# Patient Record
Sex: Female | Born: 1980 | Hispanic: Yes | Marital: Married | Smoking: Never smoker
Health system: Southern US, Academic
[De-identification: ages and names within clinical notes are randomized; demographics above are authoritative.]

## PROBLEM LIST (undated history)

## (undated) ENCOUNTER — Inpatient Hospital Stay (HOSPITAL_COMMUNITY): Payer: Self-pay

## (undated) DIAGNOSIS — N200 Calculus of kidney: Secondary | ICD-10-CM

## (undated) HISTORY — PX: NO PAST SURGERIES: SHX2092

---

## 2017-08-19 ENCOUNTER — Ambulatory Visit (INDEPENDENT_AMBULATORY_CARE_PROVIDER_SITE_OTHER): Payer: Self-pay | Admitting: Nurse Practitioner

## 2017-08-24 ENCOUNTER — Other Ambulatory Visit (HOSPITAL_COMMUNITY): Payer: Self-pay | Admitting: Nurse Practitioner

## 2017-08-24 DIAGNOSIS — Z3A13 13 weeks gestation of pregnancy: Secondary | ICD-10-CM

## 2017-08-24 DIAGNOSIS — Z3682 Encounter for antenatal screening for nuchal translucency: Secondary | ICD-10-CM

## 2017-08-24 LAB — OB RESULTS CONSOLE ABO/RH: RH Type: POSITIVE

## 2017-08-24 LAB — OB RESULTS CONSOLE RUBELLA ANTIBODY, IGM: Rubella: IMMUNE

## 2017-08-24 LAB — OB RESULTS CONSOLE GC/CHLAMYDIA
CHLAMYDIA, DNA PROBE: NEGATIVE
GC PROBE AMP, GENITAL: NEGATIVE

## 2017-08-24 LAB — OB RESULTS CONSOLE HGB/HCT, BLOOD
HEMATOCRIT: 37 (ref 36–46)
Hemoglobin: 12.1 (ref 12.0–16.0)

## 2017-08-24 LAB — AMB REFERRAL TO OB-GYN
GLUCOSE: 90
Pap: NEGATIVE
Urine Culture, OB: NEGATIVE

## 2017-08-24 LAB — OB RESULTS CONSOLE PLATELET COUNT: Platelets: 292 (ref 150–399)

## 2017-09-13 ENCOUNTER — Encounter (HOSPITAL_COMMUNITY): Payer: Self-pay | Admitting: *Deleted

## 2017-09-13 ENCOUNTER — Inpatient Hospital Stay (HOSPITAL_COMMUNITY): Payer: Self-pay

## 2017-09-13 ENCOUNTER — Inpatient Hospital Stay (HOSPITAL_COMMUNITY)
Admission: AD | Admit: 2017-09-13 | Discharge: 2017-09-13 | Disposition: A | Discharge status: Home / Self Care | Payer: Self-pay | Source: Ambulatory Visit | Attending: Obstetrics & Gynecology | Admitting: Obstetrics & Gynecology

## 2017-09-13 DIAGNOSIS — O26891 Other specified pregnancy related conditions, first trimester: Secondary | ICD-10-CM | POA: Insufficient documentation

## 2017-09-13 DIAGNOSIS — O99619 Diseases of the digestive system complicating pregnancy, unspecified trimester: Secondary | ICD-10-CM

## 2017-09-13 DIAGNOSIS — R319 Hematuria, unspecified: Secondary | ICD-10-CM

## 2017-09-13 DIAGNOSIS — R109 Unspecified abdominal pain: Secondary | ICD-10-CM

## 2017-09-13 DIAGNOSIS — K5900 Constipation, unspecified: Secondary | ICD-10-CM

## 2017-09-13 DIAGNOSIS — Z3A11 11 weeks gestation of pregnancy: Secondary | ICD-10-CM

## 2017-09-13 LAB — CBC WITH DIFFERENTIAL/PLATELET
BASOS PCT: 0 %
Basophils Absolute: 0 10*3/uL (ref 0.0–0.1)
Eosinophils Absolute: 0 10*3/uL (ref 0.0–0.7)
Eosinophils Relative: 1 %
HEMATOCRIT: 37.1 % (ref 36.0–46.0)
HEMOGLOBIN: 12.6 g/dL (ref 12.0–15.0)
LYMPHS ABS: 1.7 10*3/uL (ref 0.7–4.0)
Lymphocytes Relative: 24 %
MCH: 28.2 pg (ref 26.0–34.0)
MCHC: 34 g/dL (ref 30.0–36.0)
MCV: 83 fL (ref 78.0–100.0)
MONO ABS: 0.6 10*3/uL (ref 0.1–1.0)
MONOS PCT: 8 %
NEUTROS ABS: 4.7 10*3/uL (ref 1.7–7.7)
Neutrophils Relative %: 67 %
Platelets: 258 10*3/uL (ref 150–400)
RBC: 4.47 MIL/uL (ref 3.87–5.11)
RDW: 18.5 % — AB (ref 11.5–15.5)
WBC: 7 10*3/uL (ref 4.0–10.5)

## 2017-09-13 LAB — COMPREHENSIVE METABOLIC PANEL
ALK PHOS: 46 U/L (ref 38–126)
ALT: 18 U/L (ref 14–54)
ANION GAP: 9 (ref 5–15)
AST: 17 U/L (ref 15–41)
Albumin: 4 g/dL (ref 3.5–5.0)
BILIRUBIN TOTAL: 0.1 mg/dL — AB (ref 0.3–1.2)
BUN: 11 mg/dL (ref 6–20)
CALCIUM: 9.3 mg/dL (ref 8.9–10.3)
CO2: 22 mmol/L (ref 22–32)
CREATININE: 0.55 mg/dL (ref 0.44–1.00)
Chloride: 104 mmol/L (ref 101–111)
Glucose, Bld: 84 mg/dL (ref 65–99)
Potassium: 4.3 mmol/L (ref 3.5–5.1)
Sodium: 135 mmol/L (ref 135–145)
TOTAL PROTEIN: 7.7 g/dL (ref 6.5–8.1)

## 2017-09-13 LAB — URINALYSIS, ROUTINE W REFLEX MICROSCOPIC
Bacteria, UA: NONE SEEN
Bilirubin Urine: NEGATIVE
GLUCOSE, UA: NEGATIVE mg/dL
Ketones, ur: NEGATIVE mg/dL
Leukocytes, UA: NEGATIVE
NITRITE: NEGATIVE
PH: 8 (ref 5.0–8.0)
PROTEIN: NEGATIVE mg/dL
Specific Gravity, Urine: 1.006 (ref 1.005–1.030)

## 2017-09-13 LAB — POCT PREGNANCY, URINE: Preg Test, Ur: POSITIVE — AB

## 2017-09-13 MED ORDER — KETOROLAC TROMETHAMINE 60 MG/2ML IM SOLN
60.0000 mg | Freq: Once | INTRAMUSCULAR | Status: AC
  Administered 2017-09-13: 60 mg via INTRAMUSCULAR
  Filled 2017-09-13: qty 2

## 2017-09-13 MED ORDER — IBUPROFEN 600 MG PO TABS: 600.0000 mg | ORAL_TABLET | Freq: Four times a day (QID) | ORAL | 0 refills | Status: DC | PRN

## 2017-09-13 MED ORDER — TAMSULOSIN HCL 0.4 MG PO CAPS: 0.4000 mg | ORAL_CAPSULE | Freq: Every day | ORAL | 0 refills | Status: DC

## 2017-09-13 MED ORDER — ONDANSETRON 8 MG PO TBDP
8.0000 mg | ORAL_TABLET | Freq: Once | ORAL | Status: AC
Start: 2017-09-13 — End: 2017-09-13
  Administered 2017-09-13: 8 mg via ORAL
  Filled 2017-09-13: qty 1

## 2017-09-13 NOTE — Discharge Instructions (Signed)
Estreimiento en los adultos Constipation, Adult Se llama estreimiento cuando:  Tiene deposiciones (defeca) una menor cantidad de veces a la semana de lo normal.  Tiene dificultad para defecar.  Las heces son secas y duras o son ms grandes que lo normal.  Siga estas indicaciones en su casa: Comida y bebida   Consuma alimentos con alto contenido de [place], por ejemplo: ? Nils Pyle y verduras frescas. ? Cereales integrales. ? Frijoles.  Consuma una menor cantidad de alimentos ricos en grasas, con bajo contenido de [place] o excesivamente procesados, como: ? Papas fritas. ? Hamburguesas. ? Galletas. ? Caramelos. ? Gaseosas.  Beba suficiente lquido para mantener el pis (orina) claro o de color amarillo plido. Instrucciones generales  Haga actividad fsica con regularidad o segn las indicaciones del mdico.  Vaya al bao cuando sienta la necesidad de defecar. No se aguante las ganas.  Tome los medicamentos de venta libre y los recetados solamente como se lo haya indicado el mdico. Estos incluyen los suplementos de [place].  Realice ejercicios de reentrenamiento del suelo plvico, como: ? Respirar profundamente mientras relaja la parte inferior del vientre (abdomen). ? Relajar el suelo plvico mientras defeca.  Controle su afeccin para ver si hay cambios.  Concurra a todas las visitas de control como se lo haya indicado el mdico. Esto es importante. Comunquese con un mdico si:  Siente un dolor que empeora.  Tiene fiebre.  No ha defecado por 4das.  Vomita.  No tiene hambre.  Pierde peso.  Tiene una hemorragia en el ano.  Las deposiciones Charity fundraiser) son delgadas como un lpiz. Solicite ayuda de inmediato si:  Lance Muss, y los sntomas empeoran de repente.  Tiene prdida de materia fecal u observa [employer].  Siente el vientre ms duro o ms grande de lo normal (est hinchado).  Siente un dolor muy intenso en el vientre.  Se siente mareado o  se desmaya. Esta informacin no tiene Theme park manager el consejo del mdico. Asegrese de hacerle al mdico cualquier pregunta que tenga. Document Released: 04/26/2010 Document Revised: 06/25/2016 Document Reviewed: 09/12/2015 Elsevier Interactive Patient Education  2018 [employer]. You have constipation which is hard stools that are difficult to pass. It is important to have regular bowel movements every 1-3 days that are soft and easy to pass. Hard stools increase your risk of hemorrhoids and are very uncomfortable.   To prevent constipation you can increase the amount of fiber in your diet. Examples of foods with fiber are leafy greens, whole grain breads, oatmeal and other grains.  It is also important to drink at least eight 8oz glass of water everyday.   If you have not has a bowel movement in 4-5 days you made need to clean out your bowel.  This will have establish normal movement through your bowel.    Miralax Clean out  Take 8 capfuls of miralax in 64 oz of gatorade. You can use any fluid that appeals to you (gatorade, water, juice)  Continue to drink at least eight 8 oz glasses of water throughout the day  You can repeat with another 8 capfuls of miralax in 64 oz of gatorade if you are not having a large amount of stools  You will need to be at home and close to a bathroom for about 8 hours when you do the above as you may need to go to the bathroom frequently.   After you are cleaned out: - Start Colace100mg  twice daily - Start Miralax once  daily - Start a daily fiber supplement like metamucil or citrucel - You can safely use enemas in pregnancy  - if you are having diarrhea you can reduce to Colace once a day or miralax every other day or a 1/2 capful daily.

## 2017-09-13 NOTE — MAU Note (Signed)
Lower abdominal pain since Friday, no VB, no vaginal discharge.

## 2017-09-13 NOTE — MAU Provider Note (Signed)
History     CSN: [id number]  Arrival date and time: 09/13/17 1104   First Provider Initiated Contact with Patient 09/13/17 1135      Chief Complaint  Patient presents with  . Abdominal Pain   HPI   Ms.Bonnie Carey is a 37 y.o. [place] speaking female G1P0  @ 19w0dhere in MAU with abdominal pain. She says that the pain starts in her Left middle back and travels around to her LLQ. The pain started on Friday and has progressively gotten worse. The pain became unbearable today. No vaginal bleeding. She has not taken anything for the pain. She currently rates her pain 8/10. Spanish interpretor at bedside.   OB History    Gravida  1   Para      Term      Preterm      AB      Living        SAB      TAB      Ectopic      Multiple      Live Births              Past Medical History:  Diagnosis Date  . Medical history non-contributory     Past Surgical History:  Procedure Laterality Date  . NO PAST SURGERIES      History reviewed. No pertinent family history.  Social History   Tobacco Use  . Smoking status: Never Smoker  . Smokeless tobacco: Never Used  Substance Use Topics  . Alcohol use: Never    Frequency: Never  . Drug use: Never    Allergies: No Known Allergies  No medications prior to admission.   Results for orders placed or performed during the hospital encounter of 09/13/17 (from the past 48 hour(s))  Urinalysis, Routine w reflex microscopic     Status: Abnormal   Collection Time: 09/13/17 11:06 AM  Result Value Ref Range   Color, Urine YELLOW YELLOW   APPearance HAZY (A) CLEAR   Specific Gravity, Urine 1.006 1.005 - 1.030   pH 8.0 5.0 - 8.0   Glucose, UA NEGATIVE NEGATIVE mg/dL   Hgb urine dipstick SMALL (A) NEGATIVE   Bilirubin Urine NEGATIVE NEGATIVE   Ketones, ur NEGATIVE NEGATIVE mg/dL   Protein, ur NEGATIVE NEGATIVE mg/dL   Nitrite NEGATIVE NEGATIVE   Leukocytes, UA NEGATIVE NEGATIVE   RBC / HPF 6-10 0 - 5 RBC/hpf    WBC, UA 0-5 0 - 5 WBC/hpf   Bacteria, UA NONE SEEN NONE SEEN   Squamous Epithelial / LPF 6-10 0 - 5   Mucus PRESENT     Comment: Performed at [facility] [address], [place] [address] Pregnancy, urine POC     Status: Abnormal   Collection Time: 09/13/17 11:27 AM  Result Value Ref Range   Preg Test, Ur POSITIVE (A) NEGATIVE    Comment:        THE SENSITIVITY OF THIS METHODOLOGY IS >24 mIU/mL   CBC with Differential     Status: Abnormal   Collection Time: 09/13/17 11:55 AM  Result Value Ref Range   WBC 7.0 4.0 - 10.5 K/uL   RBC 4.47 3.87 - 5.11 MIL/uL   Hemoglobin 12.6 12.0 - 15.0 g/dL   HCT 37.1 36.0 - 46.0 %   MCV 83.0 78.0 - 100.0 fL   MCH 28.2 26.0 - 34.0 pg   MCHC 34.0 30.0 - 36.0 g/dL   RDW 18.5 (H) 11.5 - 15.5 %  Platelets 258 150 - 400 K/uL   Neutrophils Relative % 67 %   Neutro Abs 4.7 1.7 - 7.7 K/uL   Lymphocytes Relative 24 %   Lymphs Abs 1.7 0.7 - 4.0 K/uL   Monocytes Relative 8 %   Monocytes Absolute 0.6 0.1 - 1.0 K/uL   Eosinophils Relative 1 %   Eosinophils Absolute 0.0 0.0 - 0.7 K/uL   Basophils Relative 0 %   Basophils Absolute 0.0 0.0 - 0.1 K/uL    Comment: Performed at [facility], [address], [place], [place] [address]  Comprehensive metabolic panel     Status: Abnormal   Collection Time: 09/13/17 11:55 AM  Result Value Ref Range   Sodium 135 135 - 145 mmol/L   Potassium 4.3 3.5 - 5.1 mmol/L   Chloride 104 101 - 111 mmol/L   CO2 22 22 - 32 mmol/L   Glucose, Bld 84 65 - 99 mg/dL   BUN 11 6 - 20 mg/dL   Creatinine, Ser 0.55 0.44 - 1.00 mg/dL   Calcium 9.3 8.9 - 10.3 mg/dL   Total Protein 7.7 6.5 - 8.1 g/dL   Albumin 4.0 3.5 - 5.0 g/dL   AST 17 15 - 41 U/L   ALT 18 14 - 54 U/L   Alkaline Phosphatase 46 38 - 126 U/L   Total Bilirubin 0.1 (L) 0.3 - 1.2 mg/dL   GFR calc non Af Amer >60 >60 mL/min   GFR calc Af Amer >60 >60 mL/min    Comment: (NOTE) The eGFR has been calculated using the CKD EPI equation. This  calculation has not been validated in all clinical situations. eGFR's persistently <60 mL/min signify possible Chronic Kidney Disease.    Anion gap 9 5 - 15    Comment: Performed at [facility], [address] [place], [place] [address]   US Renal  Result Date: 09/13/2017 CLINICAL DATA:  Left flank pain and hematuria.  [redacted] weeks pregnant. EXAM: RENAL / URINARY TRACT ULTRASOUND COMPLETE COMPARISON:  None. FINDINGS: Right Kidney: Length: 9.9 cm. Echogenicity within normal limits. No mass or hydronephrosis visualized. Left Kidney: Length: 10.0 cm. Echogenicity within normal limits. 1 cm cyst seen in upper pole. No mass or hydronephrosis visualized. Bladder: Appears normal for degree of bladder distention. IMPRESSION: No evidence of hydronephrosis. Tiny left renal cyst incidentally noted. Electronically Signed   By: Earle Gell M.D.   On: 09/13/2017 14:20   Review of Systems  Constitutional: Negative for fever.  Gastrointestinal: Positive for abdominal pain and nausea. Negative for vomiting.  Genitourinary: Positive for flank pain. Negative for dysuria.  Musculoskeletal: Positive for back pain.   Physical Exam   Blood pressure (!) 101/59, pulse 70, temperature 98.1 F (36.7 C), temperature source Oral, resp. rate 18, height _0  (1.499 m), weight 117 lb (53.1 kg).  Physical Exam  Constitutional: She is oriented to person, place, and time. She appears well-developed and well-nourished. She appears distressed.  HENT:  Head: Normocephalic.  Eyes: Pupils are equal, round, and reactive to light.  GI: Soft. Normal appearance and bowel sounds are normal. She exhibits no mass. There is generalized tenderness. There is CVA tenderness (Left side ). There is no rigidity, no rebound and no guarding.  Musculoskeletal: Normal range of motion.  Neurological: She is alert and oriented to person, place, and time.  Skin: Skin is warm. She is not diaphoretic.  Psychiatric: Her behavior is normal.     MAU Course  Procedures  MDM  + fetal hear  tones via doppler. Hematuria> Renal US ordered   Toradol 60 mg IM given, patient down from 10/10 to 2/10 Zofran 8 mg ODT Urine culture. Discussed renal US results with Dr. Elonda Husky, ok for DC home.   Assessment and Plan   A:  1. Abdominal pain in pregnancy, first trimester   2. Flank pain   3. [redacted] weeks gestation of pregnancy   4. Hematuria   5. Constipation during pregnancy, antepartum     P:  Discharge home in stable condition Rx: Flomax, ibuprofen X 48 hours only Strain all urine Return to MAU with worsening symptoms Follow up with OB as scheduled  Noni Saupe I, NP 09/13/2017 3:25 PM

## 2017-09-15 LAB — CULTURE, OB URINE: Special Requests: NORMAL

## 2017-09-21 LAB — CYSTIC FIBROSIS DIAGNOSTIC STUDY: Interpretation-CFDNA:: NEGATIVE

## 2017-09-22 ENCOUNTER — Encounter (HOSPITAL_COMMUNITY): Payer: Self-pay

## 2017-09-28 ENCOUNTER — Encounter (HOSPITAL_COMMUNITY): Payer: Self-pay

## 2017-09-30 ENCOUNTER — Ambulatory Visit (HOSPITAL_COMMUNITY)
Admission: RE | Admit: 2017-09-30 | Discharge: 2017-09-30 | Disposition: A | Discharge status: Home / Self Care | Payer: Self-pay | Source: Ambulatory Visit | Attending: Nurse Practitioner | Admitting: Nurse Practitioner

## 2017-09-30 ENCOUNTER — Other Ambulatory Visit (HOSPITAL_COMMUNITY): Payer: Self-pay | Admitting: Nurse Practitioner

## 2017-09-30 ENCOUNTER — Ambulatory Visit (HOSPITAL_COMMUNITY): Admission: RE | Admit: 2017-09-30 | Payer: Self-pay | Source: Ambulatory Visit

## 2017-09-30 DIAGNOSIS — Z3A13 13 weeks gestation of pregnancy: Secondary | ICD-10-CM

## 2017-09-30 DIAGNOSIS — O09511 Supervision of elderly primigravida, first trimester: Secondary | ICD-10-CM

## 2017-09-30 DIAGNOSIS — Z3682 Encounter for antenatal screening for nuchal translucency: Secondary | ICD-10-CM | POA: Insufficient documentation

## 2017-09-30 DIAGNOSIS — O09521 Supervision of elderly multigravida, first trimester: Secondary | ICD-10-CM | POA: Insufficient documentation

## 2017-09-30 HISTORY — DX: Calculus of kidney: N20.0

## 2017-09-30 NOTE — ED Notes (Signed)
Spanish interpreter Eda Royal at the bedside.  Genetic counseling explained to pt.  Pt chose to have GC after her ultrasound today.  Appointment added.

## 2017-09-30 NOTE — Progress Notes (Signed)
Appointment Date: 09/30/17 DOB: Jun 20, 1980 Referring Provider: Felipe DroneMontoya, Margareta, NP Attending: Dr. Derinda SisJeff Denney  Bonnie Carey and her husband, Silvano RuskJuan Radriguez, were seen for genetic counseling because of a maternal age of 37 y.o..   [employer] video interpreters, Cicero Duckrika [phone]([id number]) and Marthenia RollingHugo [phone]([id number]) provide Spanish/English translation.  In summary:  Discussed AMA and associated risk for fetal aneuploidy  Discussed options for screening  Quad screen  NIPS  Ultrasound  Discussed diagnostic testing options  [facility]  Amniocentesis  Reviewed family history concerns  Discussed carrier screening options - ordered at office of referring provider  CF  SMA  Hemoglobinopathies  They were counseled regarding maternal age and the association with risk for chromosome conditions due to nondisjunction with aging of the ova.   We reviewed chromosomes, nondisjunction, and the associated 1 in 114 risk for fetal aneuploidy related to a maternal age of 37 y.o. at 5537w3d gestation. They were counseled that the risk for aneuploidy decreases as gestational age increases, accounting for those pregnancies which spontaneously abort.  We specifically discussed Down syndrome (trisomy 7121), trisomies 2813 and 5918, and sex chromosome aneuploidies (47,XXX and 47,XXY) including the common features and prognoses of each.   We reviewed available screening options including Quad screen, noninvasive prenatal screening (NIPS)/cell free DNA (cfDNA) screening, and detailed ultrasound.  They were counseled that screening tests are used to modify a patient's a priori risk for aneuploidy, typically based on age. This estimate provides a pregnancy specific risk assessment. We reviewed the benefits and limitations of each option. Specifically, we discussed the conditions for which each test screens, the detection rates, and false positive rates of each. They were also counseled regarding diagnostic testing via [facility] and  amniocentesis. We reviewed the approximate 1 in 300-500 risk for complications from amniocentesis, including spontaneous pregnancy loss. We discussed the possible results that the tests might provide including: positive, negative, unanticipated, and no result. Finally, they were counseled regarding the cost of each option and potential out of pocket expenses.  This couple declined invasive testing and additional screening at this time.  They would like to proceed with routine anatomy ultrasound.  They also declined Quad screening, and stated they were comfortable with the information the ultrasound alone could provide.   This couple was provided with written information regarding cystic fibrosis (CF), spinal muscular atrophy (SMA) and hemoglobinopathies including the carrier frequency, availability of carrier screening and prenatal diagnosis if indicated.  In addition, we discussed that CF and hemoglobinopathies are routinely screened for as part of the [place] newborn screening panel.  They were notified that those labs were ordered through their referring physician, and they can follow up with them for the results.  Both family histories were reviewed and found to be noncontributory for birth defects, intellectual disability, and known genetic conditions. Without further information regarding the provided family history, an accurate genetic risk cannot be calculated. Further genetic counseling is warranted if more information is obtained.  Mrs. Foye DeerGaytan Carey denied exposure to environmental toxins or chemical agents. She denied the use of alcohol, tobacco or street drugs. She denied significant viral illnesses during the course of her pregnancy. Her medical and surgical histories were noncontributory.   I counseled this couple regarding the above risks and available options.  The approximate face-to-face time with the genetic counselor was 50 minutes.  Mady Gemmaaragh Legrande Hao, MS,  Certified Genetic Counselor

## 2017-10-01 ENCOUNTER — Other Ambulatory Visit (HOSPITAL_COMMUNITY): Payer: Self-pay | Admitting: *Deleted

## 2017-10-01 DIAGNOSIS — O09512 Supervision of elderly primigravida, second trimester: Secondary | ICD-10-CM

## 2017-11-11 ENCOUNTER — Ambulatory Visit (HOSPITAL_COMMUNITY): Payer: Self-pay

## 2017-11-11 ENCOUNTER — Encounter (HOSPITAL_COMMUNITY): Payer: Self-pay

## 2018-01-11 LAB — OB RESULTS CONSOLE HIV ANTIBODY (ROUTINE TESTING): HIV: NONREACTIVE

## 2018-01-11 LAB — OB RESULTS CONSOLE RPR: RPR: NONREACTIVE

## 2018-01-13 LAB — AMB REFERRAL TO OB-GYN: GLUCOSE 3 HOUR: 87

## 2018-02-05 ENCOUNTER — Encounter: Payer: Self-pay | Admitting: *Deleted

## 2018-02-08 ENCOUNTER — Ambulatory Visit: Payer: Self-pay

## 2018-02-08 ENCOUNTER — Ambulatory Visit (INDEPENDENT_AMBULATORY_CARE_PROVIDER_SITE_OTHER): Payer: Self-pay | Admitting: Family Medicine

## 2018-02-08 ENCOUNTER — Encounter: Payer: Self-pay | Admitting: Family Medicine

## 2018-02-08 VITALS — Wt 142.0 lb

## 2018-02-08 DIAGNOSIS — K831 Obstruction of bile duct: Secondary | ICD-10-CM

## 2018-02-08 DIAGNOSIS — O0993 Supervision of high risk pregnancy, unspecified, third trimester: Secondary | ICD-10-CM

## 2018-02-08 DIAGNOSIS — O09513 Supervision of elderly primigravida, third trimester: Secondary | ICD-10-CM

## 2018-02-08 DIAGNOSIS — O26613 Liver and biliary tract disorders in pregnancy, third trimester: Secondary | ICD-10-CM

## 2018-02-08 DIAGNOSIS — O09511 Supervision of elderly primigravida, first trimester: Secondary | ICD-10-CM

## 2018-02-08 DIAGNOSIS — O0990 Supervision of high risk pregnancy, unspecified, unspecified trimester: Secondary | ICD-10-CM | POA: Insufficient documentation

## 2018-02-08 NOTE — Progress Notes (Signed)
Video interpreter Kern Alberta [phone] used for encounter.  Pt informed that the ultrasound is considered a limited OB ultrasound and is not intended to be a complete ultrasound exam.  Patient also informed that the ultrasound is not being completed with the intent of assessing for fetal or placental anomalies or any pelvic abnormalities.  Explained that the purpose of today's ultrasound is to assess for presentation, BPP and amniotic fluid volume.  Patient acknowledges the purpose of the exam and the limitations of the study.

## 2018-02-08 NOTE — Progress Notes (Signed)
[place] [facility] Scheduled 02/18/18 @ 11:30am.

## 2018-02-08 NOTE — Progress Notes (Signed)
   PRENATAL VISIT NOTE  Subjective:  Bonnie Carey is a 37 y.o. G1P0 at [redacted]w[redacted]d being seen today for initial prenatal care. She was previously seen at [facility]. Diagnosed with cholestasis of pregnancy, started on actigall and referred here for continued pregnancy care. 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  Patient reports no complaints.  Contractions: Irritability. Vag. Bleeding: None.  Movement: Present. Denies leaking of fluid.   The following portions of the patient's history were reviewed and updated as appropriate: allergies, current medications, past family history, past medical history, past social history, past surgical history and problem list. Problem list updated.  Objective:   Vitals:   02/08/18 1343  Weight: 142 lb (64.4 kg)    Fetal Status: Fetal Heart Rate (bpm): 143   Movement: Present     General:  Alert, oriented and cooperative. Patient is in no acute distress.  Skin: Skin is warm and dry. No rash noted.   Cardiovascular: Normal heart rate noted  Respiratory: Normal respiratory effort, no problems with respiration noted  Abdomen: Soft, gravid, appropriate for gestational age.  Pain/Pressure: Present     Pelvic: Cervical exam deferred        Extremities: Normal range of motion.  Edema: Trace  Mental Status: Normal mood and affect. Normal behavior. Normal judgment and thought content.   Assessment and Plan:  Pregnancy: G1P0 at [redacted]w[redacted]d  1. Supervision of high risk pregnancy, antepartum FHT and FH normal  2. Advanced maternal age, primigravida in first trimester, antepartum  3. Cholestasis during pregnancy in third trimester actigall makes her  sleepy. Currently taking TID. Will decrease to BID to see if improves.  Preterm labor symptoms and general obstetric precautions including but not limited to vaginal bleeding, contractions, leaking of fluid and fetal movement were reviewed in detail with the patient. Please refer to After Visit Summary for other counseling recommendations.  Return in about 1 week (around 02/15/2018) for NST/BPP.  No future appointments.  Levie Heritage, DO

## 2018-02-10 DIAGNOSIS — O0990 Supervision of high risk pregnancy, unspecified, unspecified trimester: Secondary | ICD-10-CM

## 2018-02-17 ENCOUNTER — Ambulatory Visit (INDEPENDENT_AMBULATORY_CARE_PROVIDER_SITE_OTHER): Payer: Self-pay | Admitting: *Deleted

## 2018-02-17 VITALS — BP 102/58 | HR 91 | Wt 143.5 lb

## 2018-02-17 DIAGNOSIS — O26643 Intrahepatic cholestasis of pregnancy, third trimester: Secondary | ICD-10-CM

## 2018-02-17 DIAGNOSIS — K831 Obstruction of bile duct: Secondary | ICD-10-CM

## 2018-02-17 DIAGNOSIS — O26613 Liver and biliary tract disorders in pregnancy, third trimester: Secondary | ICD-10-CM

## 2018-02-17 NOTE — Progress Notes (Signed)
Video interpreter Paula ComptonKarla [phone] used for encounter. Pt has US for growth and BPP @ [facility] on 11/14 @ 1130.     Acheron Sugg, [facility]  02/17/18

## 2018-02-24 ENCOUNTER — Ambulatory Visit: Payer: Self-pay

## 2018-02-24 ENCOUNTER — Ambulatory Visit (INDEPENDENT_AMBULATORY_CARE_PROVIDER_SITE_OTHER): Payer: Self-pay | Admitting: *Deleted

## 2018-02-24 ENCOUNTER — Ambulatory Visit (INDEPENDENT_AMBULATORY_CARE_PROVIDER_SITE_OTHER): Payer: Self-pay | Admitting: Family Medicine

## 2018-02-24 VITALS — BP 100/66 | HR 87 | Wt 145.9 lb

## 2018-02-24 DIAGNOSIS — O09513 Supervision of elderly primigravida, third trimester: Secondary | ICD-10-CM

## 2018-02-24 DIAGNOSIS — K831 Obstruction of bile duct: Secondary | ICD-10-CM

## 2018-02-24 DIAGNOSIS — O26613 Liver and biliary tract disorders in pregnancy, third trimester: Secondary | ICD-10-CM

## 2018-02-24 DIAGNOSIS — O0993 Supervision of high risk pregnancy, unspecified, third trimester: Secondary | ICD-10-CM

## 2018-02-24 DIAGNOSIS — Z3A34 34 weeks gestation of pregnancy: Secondary | ICD-10-CM

## 2018-02-24 DIAGNOSIS — O0990 Supervision of high risk pregnancy, unspecified, unspecified trimester: Secondary | ICD-10-CM

## 2018-02-24 DIAGNOSIS — O26643 Intrahepatic cholestasis of pregnancy, third trimester: Secondary | ICD-10-CM

## 2018-02-24 DIAGNOSIS — O09511 Supervision of elderly primigravida, first trimester: Secondary | ICD-10-CM

## 2018-02-24 NOTE — Progress Notes (Signed)
Interpreter Nile RiggsMariel Gallego present for encounter.   Pt informed that the ultrasound is considered a limited OB ultrasound and is not intended to be a complete ultrasound exam.  Patient also informed that the ultrasound is not being completed with the intent of assessing for fetal or placental anomalies or any pelvic abnormalities.  Explained that the purpose of today's ultrasound is to assess for presentation, BPP and amniotic fluid volume.  Patient acknowledges the purpose of the exam and the limitations of the study.    Labor sx reviewed w/pt and husband.

## 2018-02-24 NOTE — Progress Notes (Signed)
   PRENATAL VISIT NOTE  Subjective:  Bonnie Carey is a 37 y.o. G1P0 at 3745w3d being seen today for ongoing prenatal care.  She is currently monitored for the following issues for this high-risk pregnancy and has Advanced maternal age, primigravida in first trimester, antepartum; Supervision of high risk pregnancy, antepartum; and Cholestasis during pregnancy in third trimester on their problem list.  Patient reports no complaints and itching is improved.  Contractions: Irregular. Vag. Bleeding: None.  Movement: Present. Denies leaking of fluid.   The following portions of the patient's history were reviewed and updated as appropriate: allergies, current medications, past family history, past medical history, past social history, past surgical history and problem list. Problem list updated.  Objective:   Vitals:   02/24/18 0830  BP: 100/66  Pulse: 87  Weight: 145 lb 14.4 oz (66.2 kg)    Fetal Status: Fetal Heart Rate (bpm): NST   Movement: Present     General:  Alert, oriented and cooperative. Patient is in no acute distress.  Skin: Skin is warm and dry. No rash noted.   Cardiovascular: Normal heart rate noted  Respiratory: Normal respiratory effort, no problems with respiration noted  Abdomen: Soft, gravid, appropriate for gestational age.  Pain/Pressure: Present     Pelvic: Cervical exam deferred        Extremities: Normal range of motion.     Mental Status: Normal mood and affect. Normal behavior. Normal judgment and thought content.   Assessment and Plan:  Pregnancy: G1P0 at 4745w3d  1. Supervision of high risk pregnancy, antepartum   2. 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 Advanced maternal age, primigravida in first trimester, antepartum Nml Quad  Preterm labor symptoms  and general obstetric precautions including but not limited to vaginal bleeding, contractions, leaking of fluid and fetal movement were reviewed in detail with the patient. Please refer to After Visit Summary for other counseling recommendations.  Return in about 1 week (around 03/03/2018) for weekly as scheduled.  Future Appointments  Date Time Provider Department Center  03/03/2018  8:15 AM [facility] NST [facility] WOC  03/10/2018  9:15 AM [facility] NST [facility]OC  03/10/2018 10:15 AM Reva BoresPratt, Jasan Doughtie S, MD [facility]OC  03/17/2018  8:15 AM [facility] NST [facility] WOC  03/17/2018  9:15 AM Conan Bowensavis, Kelly M, MD [facility] WOC  03/24/2018  8:15 AM [facility]OC[facility]WOCA NST [facility] WOC  03/24/2018  9:15 AM Reva BoresPratt, Heiley Shaikh S, MD [facility] WOC  04/01/2018  8:15 AM [facility] NST [facility] WOC  04/01/2018  9:15 AM [place] BingPickens, Charlie, MD [facility]    Reva Boresanya S Terren Jandreau, MD

## 2018-02-24 NOTE — Patient Instructions (Signed)
 Lactancia materna Breastfeeding Decidir amamantar es una de las mejores elecciones que puede hacer por usted y su beb. Un cambio en las hormonas durante el embarazo hace que las mamas produzcan leche materna en las glndulas productoras de leche. Las hormonas impiden que la leche materna sea liberada antes del nacimiento del beb. Adems, impulsan el flujo de leche luego del nacimiento. Una vez que ha comenzado a amamantar, pensar en el beb, as como la succin o el llanto, pueden estimular la liberacin de leche de las glndulas productoras de leche. Los beneficios de amamantar Las investigaciones demuestran que la lactancia materna ofrece muchos beneficios de salud para bebs y madres. Adems, ofrece una forma gratuita y conveniente de alimentar al beb. Para el beb  La primera leche (calostro) ayuda a mejorar el funcionamiento del aparato digestivo del beb.  Las clulas especiales de la leche (anticuerpos) ayudan a combatir las infecciones en el beb.  Los bebs que se alimentan con leche materna tambin tienen menos probabilidades de tener asma, alergias, obesidad o diabetes de tipo 2. Adems, tienen menor riesgo de sufrir el sndrome de muerte sbita del lactante (SMSL).  Los nutrientes de la leche materna son mejores para satisfacer las necesidades del beb en comparacin con la leche maternizada.  La leche materna mejora el desarrollo cerebral del beb. Para usted  La lactancia materna favorece el desarrollo de un vnculo muy especial entre la madre y el beb.  Es conveniente. La leche materna es econmica y siempre est disponible a la temperatura correcta.  La lactancia materna ayuda a quemar caloras. Le ayuda a perder el peso ganado durante el embarazo.  Hace que el tero vuelva al tamao que tena antes del embarazo ms rpido. Adems, disminuye el sangrado (loquios) despus del parto.  La lactancia materna contribuye a reducir el riesgo de tener diabetes de tipo 2,  osteoporosis, artritis reumatoide, enfermedades cardiovasculares y cncer de mama, ovario, tero y endometrio en el futuro. Informacin bsica sobre la lactancia Comienzo de la lactancia  Encuentre un lugar cmodo para sentarse o acostarse, con un buen respaldo para el cuello y la espalda.  Coloque una almohada o una manta enrollada debajo del beb para acomodarlo a la altura de la mama (si est sentada). Las almohadas para amamantar se han diseado especialmente a fin de servir de apoyo para los brazos y el beb mientras amamanta.  Asegrese de que la barriga del beb (abdomen) est frente a la suya.  Masajee suavemente la mama. Con las yemas de los dedos, masajee los bordes exteriores de la mama hacia adentro, en direccin al pezn. Esto estimula el flujo de leche. Si la leche fluye lentamente, es posible que deba continuar con este movimiento durante la lactancia.  Sostenga la mama con 4 dedos por debajo y el pulgar por arriba del pezn (forme la letra "C" con la mano). Asegrese de que los dedos se encuentren lejos del pezn y de la boca del beb.  Empuje suavemente los labios del beb con el pezn o con el dedo.  Cuando la boca del beb se abra lo suficiente, acrquelo rpidamente a la mama e introduzca todo el pezn y la arola, tanto como sea posible, dentro de la boca del beb. La arola es la zona de color que rodea al pezn. ? Debe haber ms arola visible por arriba del labio superior del beb que por debajo del labio inferior. ? Los labios del beb deben estar abiertos y extendidos hacia afuera (evertidos) para asegurar que   el beb se prenda de forma adecuada y cmoda. ? La lengua del beb debe estar entre la enca inferior y la mama.  Asegrese de que la boca del beb est en la posicin correcta alrededor del pezn (prendido). Los labios del beb deben crear un sello sobre la mama y estar doblados hacia afuera (invertidos).  Es comn que el beb succione durante 2 a 3 minutos  para que comience el flujo de leche materna. Cmo debe prenderse Es muy importante que le ensee al beb cmo prenderse adecuadamente a la mama. Si el beb no se prende adecuadamente, puede causar dolor en los pezones, reducir la produccin de leche materna y hacer que el beb tenga un escaso aumento de peso. Adems, si el beb no se prende adecuadamente al pezn, puede tragar aire durante la alimentacin. Esto puede causarle molestias al beb. Hacer eructar al beb al cambiar de mama puede ayudarlo a liberar el aire. Sin embargo, ensearle al beb cmo prenderse a la mama adecuadamente es la mejor manera de evitar que se sienta molesto por tragar aire mientras se alimenta. Signos de que el beb se ha prendido adecuadamente al pezn  Tironea o succiona de modo silencioso, sin causarle dolor. Los labios del beb deben estar extendidos hacia afuera (evertidos).  Se escucha que traga cada 3 o 4 succiones una vez que la leche ha comenzado a fluir (despus de que se produzca el reflejo de eyeccin de la leche).  Hay movimientos musculares por arriba y por delante de sus odos al succionar.  Signos de que el beb no se ha prendido adecuadamente al pezn  Hace ruidos de succin o de chasquido mientras se alimenta.  Siente dolor en los pezones.  Si cree que el beb no se prendi correctamente, deslice el dedo en la comisura de la boca y colquelo entre las encas del beb para interrumpir la succin. Intente volver a comenzar a amamantar. Signos de lactancia materna exitosa Signos del beb  El beb disminuir gradualmente el nmero de succiones o dejar de succionar por completo.  El beb se quedar dormido.  El cuerpo del beb se relajar.  El beb retendr una pequea cantidad de leche en la boca.  El beb se desprender solo del pecho.  Signos que presenta usted  Las mamas han aumentado la firmeza, el peso y el tamao 1 a 3 horas despus de amamantar.  Estn ms blandas inmediatamente  despus de amamantar.  Se producen un aumento del volumen de leche y un cambio en su consistencia y color hacia el quinto da de lactancia.  Los pezones no duelen, no estn agrietados ni sangran.  Signos de que su beb recibe la cantidad de leche suficiente  Mojar por lo menos 1 o 2paales durante las primeras 24horas despus del nacimiento.  Mojar por lo menos 5 o 6paales cada 24horas durante la primera semana despus del nacimiento. La orina debe ser clara o de color amarillo plido a los 5das de vida.  Mojar entre 6 y 8paales cada 24horas a medida que el beb sigue creciendo y desarrollndose.  Defeca por lo menos 3 veces en 24 horas a los 5 das de vida. Las heces deben ser blandas y amarillentas.  Defeca por lo menos 3 veces en 24 horas a los 7 das de vida. Las heces deben ser grumosas y amarillentas.  No registra una prdida de peso mayor al 10% del peso al nacer durante los primeros 3 das de vida.  Aumenta de peso un   promedio de 4 a 7onzas (113 a 198g) por semana despus de los 4 das de vida.  Aumenta de peso, diariamente, de manera uniforme a partir de los 5 das de vida, sin registrar prdida de peso despus de las 2semanas de vida. Despus de alimentarse, es posible que el beb regurgite una pequea cantidad de leche. Esto es normal. Frecuencia y duracin de la lactancia El amamantamiento frecuente la ayudar a producir ms leche y puede prevenir dolores en los pezones y las mamas extremadamente llenas (congestin mamaria). Alimente al beb cuando muestre signos de hambre o si siente la necesidad de reducir la congestin de las mamas. Esto se denomina "lactancia a demanda". Las seales de que el beb tiene hambre incluyen las siguientes:  Aumento del estado de alerta, actividad o inquietud.  Mueve la cabeza de un lado a otro.  Abre la boca cuando se le toca la mejilla o la comisura de la boca (reflejo de bsqueda).  Aumenta las vocalizaciones, tales como  sonidos de succin, se relame los labios, emite arrullos, suspiros o chirridos.  Mueve la mano hacia la boca y se chupa los dedos o las manos.  Est molesto o llora.  Evite el uso del chupete en las primeras 4 a 6 semanas despus del nacimiento del beb. Despus de este perodo, podr usar un chupete. Las investigaciones demostraron que el uso del chupete durante el primer ao de vida del beb disminuye el riesgo de tener el sndrome de muerte sbita del lactante (SMSL). Permita que el nio se alimente en cada mama todo lo que desee. Cuando el beb se desprende o se queda dormido mientras se est alimentando de la primera mama, ofrzcale la segunda. Debido a que, con frecuencia, los recin nacidos estn somnolientos las primeras semanas de vida, es posible que deba despertar al beb para alimentarlo. Los horarios de lactancia varan de un beb a otro. Sin embargo, las siguientes reglas pueden servir como gua para ayudarla a garantizar que el beb se alimenta adecuadamente:  Se puede amamantar a los recin nacidos (bebs de 4 semanas o menos de vida) cada 1 a 3 horas.  No deben transcurrir ms de 3 horas durante el da o 5 horas durante la noche sin que se amamante a los recin nacidos.  Debe amamantar al beb un mnimo de 8 veces en un perodo de 24 horas.  Extraccin de leche materna La extraccin y el almacenamiento de la leche materna le permiten asegurarse de que el beb se alimente exclusivamente de su leche materna, aun en momentos en los que no puede amamantar. Esto tiene especial importancia si debe regresar al trabajo en el perodo en que an est amamantando o si no puede estar presente en los momentos en que el beb debe alimentarse. Su asesor en lactancia puede ayudarla a encontrar un mtodo de extraccin que funcione mejor para usted y orientarla sobre cunto tiempo es seguro almacenar leche materna. Cmo cuidar las mamas durante la lactancia Los pezones pueden secarse, agrietarse y  doler durante la lactancia. Las siguientes recomendaciones pueden ayudarla a mantener las mamas humectadas y sanas:  Evite usar jabn en los pezones.  Use un sostn de soporte diseado especialmente para la lactancia materna. Evite usar sostenes con aro o sostenes muy ajustados (sostenes deportivos).  Seque al aire sus pezones durante 3 a 4minutos despus de amamantar al beb.  Utilice solo apsitos de algodn en el sostn para absorber las prdidas de leche. La prdida de un poco de leche materna   entre las tomas es normal.  Utilice lanolina sobre los pezones luego de amamantar. La lanolina ayuda a mantener la humedad normal de la piel. La lanolina pura no es perjudicial (no es txica) para el beb. Adems, puede extraer manualmente algunas gotas de leche materna y masajear suavemente esa leche sobre los pezones para que la leche se seque al aire.  Durante las primeras semanas despus del nacimiento, algunas mujeres experimentan congestin mamaria. La congestin mamaria puede hacer que sienta las mamas pesadas, calientes y sensibles al tacto. El pico de la congestin mamaria ocurre en el plazo de los 3 a 5 das despus del parto. Las siguientes recomendaciones pueden ayudarla a aliviar la congestin mamaria:  Vace por completo las mamas al amamantar o extraer leche. Puede aplicar calor hmedo en las mamas (en la ducha o con toallas hmedas para manos) antes de amamantar o extraer leche. Esto aumenta la circulacin y ayuda a que la leche fluya. Si el beb no vaca por completo las mamas cuando lo amamanta, extraiga la leche restante despus de que haya finalizado.  Aplique compresas de hielo sobre las mamas inmediatamente despus de amamantar o extraer leche, a menos que le resulte demasiado incmodo. Haga lo siguiente: ? Ponga el hielo en una bolsa plstica. ? Coloque una toalla entre la piel y la bolsa de hielo. ? Coloque el hielo durante 20minutos, 2 o 3veces por da.  Asegrese de que el  beb est prendido y se encuentre en la posicin correcta mientras lo alimenta.  Si la congestin mamaria persiste luego de 48 horas o despus de seguir estas recomendaciones, comunquese con su mdico o un asesor en lactancia. Recomendaciones de salud general durante la lactancia  Consuma 3 comidas y 3 colaciones saludables todos los das. Las madres bien alimentadas que amamantan necesitan entre 450 y 500 caloras adicionales por da. Puede cumplir con este requisito al aumentar la cantidad de una dieta equilibrada que realice.  Beba suficiente agua para mantener la orina clara o de color amarillo plido.  Descanse con frecuencia, reljese y siga tomando sus vitaminas prenatales para prevenir la fatiga, el estrs y los niveles bajos de vitaminas y minerales en el cuerpo (deficiencias de nutrientes).  No consuma ningn producto que contenga nicotina o tabaco, como cigarrillos y cigarrillos electrnicos. El beb puede verse afectado por las sustancias qumicas de los cigarrillos que pasan a la leche materna y por la exposicin al humo ambiental del tabaco. Si necesita ayuda para dejar de fumar, consulte al mdico.  Evite el consumo de alcohol.  No consuma drogas ilegales o marihuana.  Antes de usar cualquier medicamento, hable con el mdico. Estos incluyen medicamentos recetados y de venta libre, como tambin vitaminas y suplementos a base de hierbas. Algunos medicamentos, que pueden ser perjudiciales para el beb, pueden pasar a travs de la leche materna.  Puede quedar embarazada durante la lactancia. Si se desea un mtodo anticonceptivo, consulte al mdico sobre cules son las opciones seguras durante la lactancia. Dnde encontrar ms informacin: Liga internacional La Leche: www.llli.org. Comunquese con un mdico si:  Siente que quiere dejar de amamantar o se siente frustrada con la lactancia.  Sus pezones estn agrietados o sangran.  Sus mamas estn irritadas, sensibles o  calientes.  Tiene los siguientes sntomas: ? Dolor en las mamas o en los pezones. ? Un rea hinchada en cualquiera de las mamas. ? Fiebre o escalofros. ? Nuseas o vmitos. ? Drenaje de otro lquido distinto de la leche materna desde los pezones.    Sus mamas no se llenan antes de amamantar al beb para el quinto da despus del parto.  Se siente triste y deprimida.  El beb: ? Est demasiado somnoliento como para comer bien. ? Tiene problemas para dormir. ? Tiene ms de 1 semana de vida y moja menos de 6 paales en un periodo de 24 horas. ? No ha aumentado de peso a los 5 das de vida.  El beb defeca menos de 3 veces en 24 horas.  La piel del beb o las partes blancas de los ojos se vuelven amarillentas. Solicite ayuda de inmediato si:  El beb est muy cansado (letargo) y no se quiere despertar para comer.  Le sube la fiebre sin causa. Resumen  La lactancia materna ofrece muchos beneficios de salud para bebs y madres.  Intente amamantar a su beb cuando muestre signos tempranos de hambre.  Haga cosquillas o empuje suavemente los labios del beb con el dedo o el pezn para lograr que el beb abra la boca. Acerque el beb a la mama. Asegrese de que la mayor parte de la arola se encuentre dentro de la boca del beb. Ofrzcale una mama y haga eructar al beb antes de pasar a la otra.  Hable con su mdico o asesor en lactancia si tiene dudas o problemas con la lactancia. Esta informacin no tiene como fin reemplazar el consejo del mdico. Asegrese de hacerle al mdico cualquier pregunta que tenga. Document Released: 03/24/2005 Document Revised: 07/14/2016 Document Reviewed: 07/14/2016 Elsevier Interactive Patient Education  2018 [employer]  

## 2018-03-03 ENCOUNTER — Ambulatory Visit (INDEPENDENT_AMBULATORY_CARE_PROVIDER_SITE_OTHER): Payer: Self-pay | Admitting: General Practice

## 2018-03-03 ENCOUNTER — Telehealth (HOSPITAL_COMMUNITY): Payer: Self-pay | Admitting: *Deleted

## 2018-03-03 ENCOUNTER — Ambulatory Visit: Payer: Self-pay

## 2018-03-03 ENCOUNTER — Encounter: Payer: Self-pay | Admitting: Obstetrics & Gynecology

## 2018-03-03 VITALS — BP 107/60 | HR 104 | Wt 147.0 lb

## 2018-03-03 DIAGNOSIS — K831 Obstruction of bile duct: Secondary | ICD-10-CM

## 2018-03-03 DIAGNOSIS — O26613 Liver and biliary tract disorders in pregnancy, third trimester: Principal | ICD-10-CM

## 2018-03-03 DIAGNOSIS — O0990 Supervision of high risk pregnancy, unspecified, unspecified trimester: Secondary | ICD-10-CM

## 2018-03-03 NOTE — Telephone Encounter (Signed)
Preadmission screen  

## 2018-03-03 NOTE — Progress Notes (Addendum)
Pt informed that the ultrasound is considered a limited OB ultrasound and is not intended to be a complete ultrasound exam.  Patient also informed that the ultrasound is not being completed with the intent of assessing for fetal or placental anomalies or any pelvic abnormalities.  Explained that the purpose of today's ultrasound is to assess for  BPP, presentation and AFI.  Patient acknowledges the purpose of the exam and the limitations of the study.    Scheduled IOL 12/8 @ 8am   Chase Callerarrie H RN BSN 03/03/18

## 2018-03-10 ENCOUNTER — Encounter: Payer: Self-pay | Admitting: Family Medicine

## 2018-03-10 ENCOUNTER — Other Ambulatory Visit (HOSPITAL_COMMUNITY)
Admission: RE | Admit: 2018-03-10 | Discharge: 2018-03-10 | Disposition: A | Discharge status: Home / Self Care | Payer: Medicaid Other | Source: Ambulatory Visit | Attending: Family Medicine | Admitting: Family Medicine

## 2018-03-10 ENCOUNTER — Ambulatory Visit (INDEPENDENT_AMBULATORY_CARE_PROVIDER_SITE_OTHER): Payer: Self-pay | Admitting: General Practice

## 2018-03-10 ENCOUNTER — Ambulatory Visit: Payer: Self-pay

## 2018-03-10 ENCOUNTER — Ambulatory Visit (INDEPENDENT_AMBULATORY_CARE_PROVIDER_SITE_OTHER): Payer: Self-pay | Admitting: Family Medicine

## 2018-03-10 VITALS — BP 109/72 | HR 92 | Wt 150.0 lb

## 2018-03-10 DIAGNOSIS — O0993 Supervision of high risk pregnancy, unspecified, third trimester: Secondary | ICD-10-CM

## 2018-03-10 DIAGNOSIS — O26613 Liver and biliary tract disorders in pregnancy, third trimester: Secondary | ICD-10-CM | POA: Insufficient documentation

## 2018-03-10 DIAGNOSIS — O09511 Supervision of elderly primigravida, first trimester: Secondary | ICD-10-CM

## 2018-03-10 DIAGNOSIS — K831 Obstruction of bile duct: Secondary | ICD-10-CM | POA: Insufficient documentation

## 2018-03-10 DIAGNOSIS — O0990 Supervision of high risk pregnancy, unspecified, unspecified trimester: Secondary | ICD-10-CM

## 2018-03-10 DIAGNOSIS — Z3A36 36 weeks gestation of pregnancy: Secondary | ICD-10-CM

## 2018-03-10 LAB — OB RESULTS CONSOLE GC/CHLAMYDIA: Gonorrhea: NEGATIVE

## 2018-03-10 LAB — OB RESULTS CONSOLE GBS: GBS: NEGATIVE

## 2018-03-10 NOTE — Patient Instructions (Signed)
 Lactancia materna Breastfeeding Decidir amamantar es una de las mejores elecciones que puede hacer por usted y su beb. Un cambio en las hormonas durante el embarazo hace que las mamas produzcan leche materna en las glndulas productoras de leche. Las hormonas impiden que la leche materna sea liberada antes del nacimiento del beb. Adems, impulsan el flujo de leche luego del nacimiento. Una vez que ha comenzado a amamantar, pensar en el beb, as como la succin o el llanto, pueden estimular la liberacin de leche de las glndulas productoras de leche. Los beneficios de amamantar Las investigaciones demuestran que la lactancia materna ofrece muchos beneficios de salud para bebs y madres. Adems, ofrece una forma gratuita y conveniente de alimentar al beb. Para el beb  La primera leche (calostro) ayuda a mejorar el funcionamiento del aparato digestivo del beb.  Las clulas especiales de la leche (anticuerpos) ayudan a combatir las infecciones en el beb.  Los bebs que se alimentan con leche materna tambin tienen menos probabilidades de tener asma, alergias, obesidad o diabetes de tipo 2. Adems, tienen menor riesgo de sufrir el sndrome de muerte sbita del lactante (SMSL).  Los nutrientes de la leche materna son mejores para satisfacer las necesidades del beb en comparacin con la leche maternizada.  La leche materna mejora el desarrollo cerebral del beb. Para usted  La lactancia materna favorece el desarrollo de un vnculo muy especial entre la madre y el beb.  Es conveniente. La leche materna es econmica y siempre est disponible a la temperatura correcta.  La lactancia materna ayuda a quemar caloras. Le ayuda a perder el peso ganado durante el embarazo.  Hace que el tero vuelva al tamao que tena antes del embarazo ms rpido. Adems, disminuye el sangrado (loquios) despus del parto.  La lactancia materna contribuye a reducir el riesgo de tener diabetes de tipo 2,  osteoporosis, artritis reumatoide, enfermedades cardiovasculares y cncer de mama, ovario, tero y endometrio en el futuro. Informacin bsica sobre la lactancia Comienzo de la lactancia  Encuentre un lugar cmodo para sentarse o acostarse, con un buen respaldo para el cuello y la espalda.  Coloque una almohada o una manta enrollada debajo del beb para acomodarlo a la altura de la mama (si est sentada). Las almohadas para amamantar se han diseado especialmente a fin de servir de apoyo para los brazos y el beb mientras amamanta.  Asegrese de que la barriga del beb (abdomen) est frente a la suya.  Masajee suavemente la mama. Con las yemas de los dedos, masajee los bordes exteriores de la mama hacia adentro, en direccin al pezn. Esto estimula el flujo de leche. Si la leche fluye lentamente, es posible que deba continuar con este movimiento durante la lactancia.  Sostenga la mama con 4 dedos por debajo y el pulgar por arriba del pezn (forme la letra "C" con la mano). Asegrese de que los dedos se encuentren lejos del pezn y de la boca del beb.  Empuje suavemente los labios del beb con el pezn o con el dedo.  Cuando la boca del beb se abra lo suficiente, acrquelo rpidamente a la mama e introduzca todo el pezn y la arola, tanto como sea posible, dentro de la boca del beb. La arola es la zona de color que rodea al pezn. ? Debe haber ms arola visible por arriba del labio superior del beb que por debajo del labio inferior. ? Los labios del beb deben estar abiertos y extendidos hacia afuera (evertidos) para asegurar que   el beb se prenda de forma adecuada y cmoda. ? La lengua del beb debe estar entre la enca inferior y la mama.  Asegrese de que la boca del beb est en la posicin correcta alrededor del pezn (prendido). Los labios del beb deben crear un sello sobre la mama y estar doblados hacia afuera (invertidos).  Es comn que el beb succione durante 2 a 3 minutos  para que comience el flujo de leche materna. Cmo debe prenderse Es muy importante que le ensee al beb cmo prenderse adecuadamente a la mama. Si el beb no se prende adecuadamente, puede causar dolor en los pezones, reducir la produccin de leche materna y hacer que el beb tenga un escaso aumento de peso. Adems, si el beb no se prende adecuadamente al pezn, puede tragar aire durante la alimentacin. Esto puede causarle molestias al beb. Hacer eructar al beb al cambiar de mama puede ayudarlo a liberar el aire. Sin embargo, ensearle al beb cmo prenderse a la mama adecuadamente es la mejor manera de evitar que se sienta molesto por tragar aire mientras se alimenta. Signos de que el beb se ha prendido adecuadamente al pezn  Tironea o succiona de modo silencioso, sin causarle dolor. Los labios del beb deben estar extendidos hacia afuera (evertidos).  Se escucha que traga cada 3 o 4 succiones una vez que la leche ha comenzado a fluir (despus de que se produzca el reflejo de eyeccin de la leche).  Hay movimientos musculares por arriba y por delante de sus odos al succionar.  Signos de que el beb no se ha prendido adecuadamente al pezn  Hace ruidos de succin o de chasquido mientras se alimenta.  Siente dolor en los pezones.  Si cree que el beb no se prendi correctamente, deslice el dedo en la comisura de la boca y colquelo entre las encas del beb para interrumpir la succin. Intente volver a comenzar a amamantar. Signos de lactancia materna exitosa Signos del beb  El beb disminuir gradualmente el nmero de succiones o dejar de succionar por completo.  El beb se quedar dormido.  El cuerpo del beb se relajar.  El beb retendr una pequea cantidad de leche en la boca.  El beb se desprender solo del pecho.  Signos que presenta usted  Las mamas han aumentado la firmeza, el peso y el tamao 1 a 3 horas despus de amamantar.  Estn ms blandas inmediatamente  despus de amamantar.  Se producen un aumento del volumen de leche y un cambio en su consistencia y color hacia el quinto da de lactancia.  Los pezones no duelen, no estn agrietados ni sangran.  Signos de que su beb recibe la cantidad de leche suficiente  Mojar por lo menos 1 o 2paales durante las primeras 24horas despus del nacimiento.  Mojar por lo menos 5 o 6paales cada 24horas durante la primera semana despus del nacimiento. La orina debe ser clara o de color amarillo plido a los 5das de vida.  Mojar entre 6 y 8paales cada 24horas a medida que el beb sigue creciendo y desarrollndose.  Defeca por lo menos 3 veces en 24 horas a los 5 das de vida. Las heces deben ser blandas y amarillentas.  Defeca por lo menos 3 veces en 24 horas a los 7 das de vida. Las heces deben ser grumosas y amarillentas.  No registra una prdida de peso mayor al 10% del peso al nacer durante los primeros 3 das de vida.  Aumenta de peso un   promedio de 4 a 7onzas (113 a 198g) por semana despus de los 4 das de vida.  Aumenta de peso, diariamente, de manera uniforme a partir de los 5 das de vida, sin registrar prdida de peso despus de las 2semanas de vida. Despus de alimentarse, es posible que el beb regurgite una pequea cantidad de leche. Esto es normal. Frecuencia y duracin de la lactancia El amamantamiento frecuente la ayudar a producir ms leche y puede prevenir dolores en los pezones y las mamas extremadamente llenas (congestin mamaria). Alimente al beb cuando muestre signos de hambre o si siente la necesidad de reducir la congestin de las mamas. Esto se denomina "lactancia a demanda". Las seales de que el beb tiene hambre incluyen las siguientes:  Aumento del estado de alerta, actividad o inquietud.  Mueve la cabeza de un lado a otro.  Abre la boca cuando se le toca la mejilla o la comisura de la boca (reflejo de bsqueda).  Aumenta las vocalizaciones, tales como  sonidos de succin, se relame los labios, emite arrullos, suspiros o chirridos.  Mueve la mano hacia la boca y se chupa los dedos o las manos.  Est molesto o llora.  Evite el uso del chupete en las primeras 4 a 6 semanas despus del nacimiento del beb. Despus de este perodo, podr usar un chupete. Las investigaciones demostraron que el uso del chupete durante el primer ao de vida del beb disminuye el riesgo de tener el sndrome de muerte sbita del lactante (SMSL). Permita que el nio se alimente en cada mama todo lo que desee. Cuando el beb se desprende o se queda dormido mientras se est alimentando de la primera mama, ofrzcale la segunda. Debido a que, con frecuencia, los recin nacidos estn somnolientos las primeras semanas de vida, es posible que deba despertar al beb para alimentarlo. Los horarios de lactancia varan de un beb a otro. Sin embargo, las siguientes reglas pueden servir como gua para ayudarla a garantizar que el beb se alimenta adecuadamente:  Se puede amamantar a los recin nacidos (bebs de 4 semanas o menos de vida) cada 1 a 3 horas.  No deben transcurrir ms de 3 horas durante el da o 5 horas durante la noche sin que se amamante a los recin nacidos.  Debe amamantar al beb un mnimo de 8 veces en un perodo de 24 horas.  Extraccin de leche materna La extraccin y el almacenamiento de la leche materna le permiten asegurarse de que el beb se alimente exclusivamente de su leche materna, aun en momentos en los que no puede amamantar. Esto tiene especial importancia si debe regresar al trabajo en el perodo en que an est amamantando o si no puede estar presente en los momentos en que el beb debe alimentarse. Su asesor en lactancia puede ayudarla a encontrar un mtodo de extraccin que funcione mejor para usted y orientarla sobre cunto tiempo es seguro almacenar leche materna. Cmo cuidar las mamas durante la lactancia Los pezones pueden secarse, agrietarse y  doler durante la lactancia. Las siguientes recomendaciones pueden ayudarla a mantener las mamas humectadas y sanas:  Evite usar jabn en los pezones.  Use un sostn de soporte diseado especialmente para la lactancia materna. Evite usar sostenes con aro o sostenes muy ajustados (sostenes deportivos).  Seque al aire sus pezones durante 3 a 4minutos despus de amamantar al beb.  Utilice solo apsitos de algodn en el sostn para absorber las prdidas de leche. La prdida de un poco de leche materna   entre las tomas es normal.  Utilice lanolina sobre los pezones luego de amamantar. La lanolina ayuda a mantener la humedad normal de la piel. La lanolina pura no es perjudicial (no es txica) para el beb. Adems, puede extraer manualmente algunas gotas de leche materna y masajear suavemente esa leche sobre los pezones para que la leche se seque al aire.  Durante las primeras semanas despus del nacimiento, algunas mujeres experimentan congestin mamaria. La congestin mamaria puede hacer que sienta las mamas pesadas, calientes y sensibles al tacto. El pico de la congestin mamaria ocurre en el plazo de los 3 a 5 das despus del parto. Las siguientes recomendaciones pueden ayudarla a aliviar la congestin mamaria:  Vace por completo las mamas al amamantar o extraer leche. Puede aplicar calor hmedo en las mamas (en la ducha o con toallas hmedas para manos) antes de amamantar o extraer leche. Esto aumenta la circulacin y ayuda a que la leche fluya. Si el beb no vaca por completo las mamas cuando lo amamanta, extraiga la leche restante despus de que haya finalizado.  Aplique compresas de hielo sobre las mamas inmediatamente despus de amamantar o extraer leche, a menos que le resulte demasiado incmodo. Haga lo siguiente: ? Ponga el hielo en una bolsa plstica. ? Coloque una toalla entre la piel y la bolsa de hielo. ? Coloque el hielo durante 20minutos, 2 o 3veces por da.  Asegrese de que el  beb est prendido y se encuentre en la posicin correcta mientras lo alimenta.  Si la congestin mamaria persiste luego de 48 horas o despus de seguir estas recomendaciones, comunquese con su mdico o un asesor en lactancia. Recomendaciones de salud general durante la lactancia  Consuma 3 comidas y 3 colaciones saludables todos los das. Las madres bien alimentadas que amamantan necesitan entre 450 y 500 caloras adicionales por da. Puede cumplir con este requisito al aumentar la cantidad de una dieta equilibrada que realice.  Beba suficiente agua para mantener la orina clara o de color amarillo plido.  Descanse con frecuencia, reljese y siga tomando sus vitaminas prenatales para prevenir la fatiga, el estrs y los niveles bajos de vitaminas y minerales en el cuerpo (deficiencias de nutrientes).  No consuma ningn producto que contenga nicotina o tabaco, como cigarrillos y cigarrillos electrnicos. El beb puede verse afectado por las sustancias qumicas de los cigarrillos que pasan a la leche materna y por la exposicin al humo ambiental del tabaco. Si necesita ayuda para dejar de fumar, consulte al mdico.  Evite el consumo de alcohol.  No consuma drogas ilegales o marihuana.  Antes de usar cualquier medicamento, hable con el mdico. Estos incluyen medicamentos recetados y de venta libre, como tambin vitaminas y suplementos a base de hierbas. Algunos medicamentos, que pueden ser perjudiciales para el beb, pueden pasar a travs de la leche materna.  Puede quedar embarazada durante la lactancia. Si se desea un mtodo anticonceptivo, consulte al mdico sobre cules son las opciones seguras durante la lactancia. Dnde encontrar ms informacin: Liga internacional La Leche: www.llli.org. Comunquese con un mdico si:  Siente que quiere dejar de amamantar o se siente frustrada con la lactancia.  Sus pezones estn agrietados o sangran.  Sus mamas estn irritadas, sensibles o  calientes.  Tiene los siguientes sntomas: ? Dolor en las mamas o en los pezones. ? Un rea hinchada en cualquiera de las mamas. ? Fiebre o escalofros. ? Nuseas o vmitos. ? Drenaje de otro lquido distinto de la leche materna desde los pezones.    Sus mamas no se llenan antes de amamantar al beb para el quinto da despus del parto.  Se siente triste y deprimida.  El beb: ? Est demasiado somnoliento como para comer bien. ? Tiene problemas para dormir. ? Tiene ms de 1 semana de vida y moja menos de 6 paales en un periodo de 24 horas. ? No ha aumentado de peso a los 5 das de vida.  El beb defeca menos de 3 veces en 24 horas.  La piel del beb o las partes blancas de los ojos se vuelven amarillentas. Solicite ayuda de inmediato si:  El beb est muy cansado (letargo) y no se quiere despertar para comer.  Le sube la fiebre sin causa. Resumen  La lactancia materna ofrece muchos beneficios de salud para bebs y madres.  Intente amamantar a su beb cuando muestre signos tempranos de hambre.  Haga cosquillas o empuje suavemente los labios del beb con el dedo o el pezn para lograr que el beb abra la boca. Acerque el beb a la mama. Asegrese de que la mayor parte de la arola se encuentre dentro de la boca del beb. Ofrzcale una mama y haga eructar al beb antes de pasar a la otra.  Hable con su mdico o asesor en lactancia si tiene dudas o problemas con la lactancia. Esta informacin no tiene como fin reemplazar el consejo del mdico. Asegrese de hacerle al mdico cualquier pregunta que tenga. Document Released: 03/24/2005 Document Revised: 07/14/2016 Document Reviewed: 07/14/2016 Elsevier Interactive Patient Education  2018 [employer]  

## 2018-03-10 NOTE — Progress Notes (Signed)
Patient reports rash/bumps that are very itchy Patient also reports a lot of swelling of left lower leg with numbness since Friday

## 2018-03-10 NOTE — Progress Notes (Signed)
Pt informed that the ultrasound is considered a limited OB ultrasound and is not intended to be a complete ultrasound exam.  Patient also informed that the ultrasound is not being completed with the intent of assessing for fetal or placental anomalies or any pelvic abnormalities.  Explained that the purpose of today's ultrasound is to assess for  BPP, presentation and AFI.  Patient acknowledges the purpose of the exam and the limitations of the study.    Lasundra Hascall H RN BSN 03/10/18  

## 2018-03-10 NOTE — Progress Notes (Addendum)
    PRENATAL VISIT NOTE Spanish interpreter: Mariel used  Subjective:  Bonnie Carey is a 37 y.o. G1P0 at 7[place]238w3d being seen today for ongoing prenatal care.  She is currently monitored for the following issues for this high-risk pregnancy and has Advanced maternal age, primigravida in first trimester, antepartum; Supervision of high risk pregnancy, antepartum; and Cholestasis during pregnancy in third trimester on their problem list.  Patient reports rash, itchy diffusely noted x 4 days. Taking Actigall for cholestasis. Also with swelling in her legs, left > right. No pain in calf, no chest pain or SOB.  Contractions: Irregular. Vag. Bleeding: None.  Movement: Present. Denies leaking of fluid.   The following portions of the patient's history were reviewed and updated as appropriate: allergies, current medications, past family history, past medical history, past social history, past surgical history and problem list. Problem list updated.  Objective:   Vitals:   03/10/18 0952  BP: 109/72  Pulse: 92  Weight: 150 lb (68 kg)    Fetal Status: Fetal Heart Rate (bpm): NST   Movement: Present     General:  Alert, oriented and cooperative. Patient is in no acute distress.  Skin: Skin is warm and dry. Macular/papular rash without vesicles.   Cardiovascular: Normal heart rate noted  Respiratory: Normal respiratory effort, no problems with respiration noted  Abdomen: Soft, gravid, appropriate for gestational age.  Pain/Pressure: Present     Pelvic: Cervical exam performed Dilation: Closed Effacement (%): Thick Station: -3  Extremities: Normal range of motion.  Edema: Moderate pitting, indentation subsides rapidly Equal measure of each calf. Negative Homan's   Mental Status: Normal mood and affect. Normal behavior. Normal judgment and thought content.   Assessment and Plan:  Pregnancy: G1P0 at 2838w3d  1. Supervision of high risk pregnancy, antepartum Cultures today - GC/Chlamydia probe  amp ([place])[facility] at [facility] - Culture, beta strep (group b only) Hydrocortisone cream for rash Elevate legs, compression hose, s/sx's of DVT/PE reviewed  2. Cholestasis during pregnancy in third trimester NST:  Baseline: 135 bpm, Variability: Good {> 6 bpm), Accelerations: Reactive and Decelerations: Absent Continue Actigall IOL at 37 wks--scheduled  Preterm labor symptoms and general obstetric precautions including but not limited to vaginal bleeding, contractions, leaking of fluid and fetal movement were reviewed in detail with the patient. Please refer to After Visit Summary for other counseling recommendations.  Return in 4 weeks (on 04/07/2018) for pp check.  Future Appointments  Date Time Provider Department Center  03/14/2018  8:00 AM [facility] RO[facility] None    Reva Boresanya S Terez Freimark, MD

## 2018-03-11 LAB — GC/CHLAMYDIA PROBE AMP (~~LOC~~) NOT AT ARMC
Chlamydia: NEGATIVE
Neisseria Gonorrhea: NEGATIVE

## 2018-03-12 ENCOUNTER — Inpatient Hospital Stay (HOSPITAL_COMMUNITY)
Admission: AD | Admit: 2018-03-12 | Discharge: 2018-03-12 | Disposition: A | Discharge status: Home / Self Care | Payer: Self-pay | Attending: Obstetrics and Gynecology | Admitting: Obstetrics and Gynecology

## 2018-03-12 ENCOUNTER — Encounter (HOSPITAL_COMMUNITY): Payer: Self-pay

## 2018-03-12 DIAGNOSIS — Z3A36 36 weeks gestation of pregnancy: Secondary | ICD-10-CM | POA: Insufficient documentation

## 2018-03-12 DIAGNOSIS — R21 Rash and other nonspecific skin eruption: Secondary | ICD-10-CM | POA: Insufficient documentation

## 2018-03-12 DIAGNOSIS — Z79899 Other long term (current) drug therapy: Secondary | ICD-10-CM | POA: Insufficient documentation

## 2018-03-12 DIAGNOSIS — O26893 Other specified pregnancy related conditions, third trimester: Secondary | ICD-10-CM | POA: Insufficient documentation

## 2018-03-12 MED ORDER — HYDROXYZINE HCL 25 MG PO TABS: 25.0000 mg | ORAL_TABLET | ORAL | 0 refills | Status: AC | PRN

## 2018-03-12 MED ORDER — DIPHENHYDRAMINE HCL 2 % EX CREA: TOPICAL_CREAM | Freq: Three times a day (TID) | CUTANEOUS | 0 refills | Status: AC | PRN

## 2018-03-12 MED ORDER — LIDOCAINE 5 % EX OINT: 1.0000 "application " | TOPICAL_OINTMENT | CUTANEOUS | 0 refills | Status: DC | PRN

## 2018-03-12 MED ORDER — HYDROXYZINE HCL 25 MG PO TABS
25.0000 mg | ORAL_TABLET | Freq: Once | ORAL | Status: AC
  Administered 2018-03-12: 25 mg via ORAL
  Filled 2018-03-12: qty 1

## 2018-03-12 NOTE — Discharge Instructions (Signed)

## 2018-03-12 NOTE — MAU Note (Signed)
Started having a rash with itching on Wednesday with only a few bumps.  Now it has spread all over her arms, abdomen and between her legs.  States she has been using cortizone cream but thinks it makes them worse.  Hasn't taken benadryl or any other medications for it.  Isn't aware of any allergies.  Also reports decreased FM.  No LOF/VB.  No ctx.

## 2018-03-12 NOTE — MAU Provider Note (Signed)
History     CSN: [id number]  Arrival date and time: 03/12/18 2157   First Provider Initiated Contact with Patient 03/12/18 2237      Chief Complaint  Patient presents with  . Rash   HPI Bonnie Carey is a 37 y.o. G1P0 at 7787w5d who presents with a rash. She states it is the same rash that Dr. Shawnie PonsPratt examined on 12/4 and prescribed hydrocortisone cream but it isn't helping. She is requesting to be induced tonight to stop the rash from getting worse. She denies any pain. Denies leaking or bleeding. Reports normal fetal movement.   OB History    Gravida  1   Para      Term      Preterm      AB      Living  0     SAB      TAB      Ectopic      Multiple      Live Births              Past Medical History:  Diagnosis Date  . Kidney stones     Past Surgical History:  Procedure Laterality Date  . NO PAST SURGERIES      No family history on file.  Social History   Tobacco Use  . Smoking status: Never Smoker  . Smokeless tobacco: Never Used  Substance Use Topics  . Alcohol use: Never    Frequency: Never  . Drug use: Never    Allergies: No Known Allergies  Medications Prior to Admission  Medication Sig Dispense Refill Last Dose  . Acetaminophen (TYLENOL PO) Take by mouth.   Taking  . Prenatal Multivit-Min-Fe-FA (PRENATAL VITAMINS PO) Take by mouth.   Taking  . ursodiol (ACTIGALL) 300 MG capsule Take 300 mg by mouth 3 (three) times daily.   Taking    Review of Systems  Constitutional: Negative.  Negative for fatigue and fever.  HENT: Negative.   Respiratory: Negative.  Negative for shortness of breath.   Cardiovascular: Negative.  Negative for chest pain.  Gastrointestinal: Negative.  Negative for abdominal pain, constipation, diarrhea, nausea and vomiting.  Genitourinary: Negative.  Negative for dysuria.  Skin: Positive for rash.  Neurological: Negative.  Negative for dizziness and headaches.   Physical Exam   Blood pressure 105/63,  pulse 99, temperature 98.2 F (36.8 C), temperature source Oral, resp. rate 17, last menstrual period 06/28/2017, SpO2 98 %.  Physical Exam  Nursing note and vitals reviewed. Constitutional: She is oriented to person, place, and time. She appears well-developed and well-nourished. No distress.  HENT:  Head: Normocephalic.  Eyes: Pupils are equal, round, and reactive to light.  Cardiovascular: Normal rate, regular rhythm and normal heart sounds.  Respiratory: Effort normal and breath sounds normal. No respiratory distress.  GI: Soft. Bowel sounds are normal. She exhibits no distension. There is no tenderness.  Neurological: She is alert and oriented to person, place, and time.  Skin: Skin is warm and dry. Rash noted.     Psychiatric: She has a normal mood and affect. Her behavior is normal. Judgment and thought content normal.    Fetal Tracing:  Baseline: 130 Variability: moderate Accels: 15x15 Decels: none  Toco: occasional uc's   MAU Course  Procedures  MDM Vistaril PO- patient reports some relief.   Assessment and Plan   1. Rash   2. [redacted] weeks gestation of pregnancy    -Discharge home in stable condition -Rx for vistaril,  benedryl cream, and lidocaine cream sent to patient's pharmacy -Labor precautions discussed -Patient advised to follow-up with [facility] as scheduled for her IOL -Patient may return to MAU as needed or if her condition were to change or worsen  Rolm Bookbinder CNM 03/12/2018, 10:37 PM

## 2018-03-14 ENCOUNTER — Inpatient Hospital Stay (HOSPITAL_COMMUNITY)
Admission: RE | Admit: 2018-03-14 | Discharge: 2018-03-21 | DRG: 786 | Disposition: A | Discharge status: Home / Self Care | Payer: Medicaid Other | Attending: Obstetrics and Gynecology | Admitting: Obstetrics and Gynecology

## 2018-03-14 ENCOUNTER — Encounter (HOSPITAL_COMMUNITY): Payer: Self-pay

## 2018-03-14 ENCOUNTER — Other Ambulatory Visit: Payer: Self-pay

## 2018-03-14 VITALS — BP 126/70 | HR 65 | Temp 97.9°F | Resp 18 | Ht 60.0 in | Wt 152.2 lb

## 2018-03-14 DIAGNOSIS — O2662 Liver and biliary tract disorders in childbirth: Principal | ICD-10-CM | POA: Diagnosis present

## 2018-03-14 DIAGNOSIS — R7989 Other specified abnormal findings of blood chemistry: Secondary | ICD-10-CM | POA: Diagnosis not present

## 2018-03-14 DIAGNOSIS — Z98891 History of uterine scar from previous surgery: Secondary | ICD-10-CM

## 2018-03-14 DIAGNOSIS — O324XX0 Maternal care for high head at term, not applicable or unspecified: Secondary | ICD-10-CM | POA: Diagnosis present

## 2018-03-14 DIAGNOSIS — Z87442 Personal history of urinary calculi: Secondary | ICD-10-CM | POA: Diagnosis not present

## 2018-03-14 DIAGNOSIS — O9962 Diseases of the digestive system complicating childbirth: Secondary | ICD-10-CM | POA: Diagnosis not present

## 2018-03-14 DIAGNOSIS — Z3A37 37 weeks gestation of pregnancy: Secondary | ICD-10-CM | POA: Diagnosis not present

## 2018-03-14 DIAGNOSIS — N179 Acute kidney failure, unspecified: Secondary | ICD-10-CM | POA: Diagnosis present

## 2018-03-14 DIAGNOSIS — R0902 Hypoxemia: Secondary | ICD-10-CM

## 2018-03-14 DIAGNOSIS — J810 Acute pulmonary edema: Secondary | ICD-10-CM | POA: Diagnosis present

## 2018-03-14 DIAGNOSIS — O9081 Anemia of the puerperium: Secondary | ICD-10-CM | POA: Diagnosis not present

## 2018-03-14 DIAGNOSIS — O26643 Intrahepatic cholestasis of pregnancy, third trimester: Secondary | ICD-10-CM | POA: Diagnosis present

## 2018-03-14 DIAGNOSIS — K831 Obstruction of bile duct: Secondary | ICD-10-CM | POA: Diagnosis present

## 2018-03-14 DIAGNOSIS — O09511 Supervision of elderly primigravida, first trimester: Secondary | ICD-10-CM

## 2018-03-14 DIAGNOSIS — O26833 Pregnancy related renal disease, third trimester: Secondary | ICD-10-CM | POA: Diagnosis present

## 2018-03-14 DIAGNOSIS — D649 Anemia, unspecified: Secondary | ICD-10-CM

## 2018-03-14 DIAGNOSIS — J811 Chronic pulmonary edema: Secondary | ICD-10-CM | POA: Diagnosis not present

## 2018-03-14 DIAGNOSIS — O26613 Liver and biliary tract disorders in pregnancy, third trimester: Secondary | ICD-10-CM

## 2018-03-14 LAB — CBC
HCT: 32.1 % — ABNORMAL LOW (ref 36.0–46.0)
Hemoglobin: 10.1 g/dL — ABNORMAL LOW (ref 12.0–15.0)
MCH: 27.5 pg (ref 26.0–34.0)
MCHC: 31.5 g/dL (ref 30.0–36.0)
MCV: 87.5 fL (ref 80.0–100.0)
PLATELETS: 283 10*3/uL (ref 150–400)
RBC: 3.67 MIL/uL — ABNORMAL LOW (ref 3.87–5.11)
RDW: 14.8 % (ref 11.5–15.5)
WBC: 5.5 10*3/uL (ref 4.0–10.5)
nRBC: 0 % (ref 0.0–0.2)

## 2018-03-14 LAB — TYPE AND SCREEN
ABO/RH(D): O POS
Antibody Screen: NEGATIVE

## 2018-03-14 LAB — OB RESULTS CONSOLE HEPATITIS B SURFACE ANTIGEN: Hepatitis B Surface Ag: NEGATIVE

## 2018-03-14 LAB — CULTURE, BETA STREP (GROUP B ONLY): STREP GP B CULTURE: NEGATIVE

## 2018-03-14 LAB — ABO/RH: ABO/RH(D): O POS

## 2018-03-14 LAB — OB RESULTS CONSOLE HIV ANTIBODY (ROUTINE TESTING): HIV: NONREACTIVE

## 2018-03-14 MED ORDER — TERBUTALINE SULFATE 1 MG/ML IJ SOLN: 0.2500 mg | Freq: Once | INTRAMUSCULAR | Status: DC | PRN

## 2018-03-14 MED ORDER — LACTATED RINGERS IV SOLN
INTRAVENOUS | Status: DC
  Administered 2018-03-14 (×2): 125 mL/h via INTRAVENOUS
  Administered 2018-03-14 – 2018-03-15 (×3): via INTRAVENOUS
  Administered 2018-03-16: 125 mL/h via INTRAVENOUS
  Administered 2018-03-16 – 2018-03-18 (×6): via INTRAVENOUS

## 2018-03-14 MED ORDER — LACTATED RINGERS IV SOLN
500.0000 mL | INTRAVENOUS | Status: DC | PRN
  Administered 2018-03-14 – 2018-03-15 (×2): 500 mL via INTRAVENOUS

## 2018-03-14 MED ORDER — OXYCODONE-ACETAMINOPHEN 5-325 MG PO TABS: 2.0000 | ORAL_TABLET | ORAL | Status: DC | PRN

## 2018-03-14 MED ORDER — FENTANYL CITRATE (PF) 100 MCG/2ML IJ SOLN
50.0000 ug | INTRAMUSCULAR | Status: DC | PRN
  Administered 2018-03-15 – 2018-03-16 (×2): 100 ug via INTRAVENOUS
  Filled 2018-03-14 (×3): qty 2

## 2018-03-14 MED ORDER — OXYTOCIN 40 UNITS IN LACTATED RINGERS INFUSION - SIMPLE MED: 2.5000 [IU]/h | INTRAVENOUS | Status: DC

## 2018-03-14 MED ORDER — FLEET ENEMA 7-19 GM/118ML RE ENEM: 1.0000 | ENEMA | RECTAL | Status: DC | PRN

## 2018-03-14 MED ORDER — MISOPROSTOL 50MCG HALF TABLET
50.0000 ug | ORAL_TABLET | Freq: Once | ORAL | Status: AC
  Administered 2018-03-14: 50 ug via BUCCAL
  Filled 2018-03-14: qty 1

## 2018-03-14 MED ORDER — OXYCODONE-ACETAMINOPHEN 5-325 MG PO TABS: 1.0000 | ORAL_TABLET | ORAL | Status: DC | PRN

## 2018-03-14 MED ORDER — MISOPROSTOL 25 MCG QUARTER TABLET
25.0000 ug | ORAL_TABLET | ORAL | Status: DC | PRN
  Administered 2018-03-14: 25 ug via VAGINAL
  Filled 2018-03-14: qty 1

## 2018-03-14 MED ORDER — OXYTOCIN BOLUS FROM INFUSION: 500.0000 mL | Freq: Once | INTRAVENOUS | Status: DC

## 2018-03-14 MED ORDER — SOD CITRATE-CITRIC ACID 500-334 MG/5ML PO SOLN
30.0000 mL | ORAL | Status: DC | PRN
  Administered 2018-03-18: 30 mL via ORAL
  Filled 2018-03-14: qty 15

## 2018-03-14 MED ORDER — ACETAMINOPHEN 325 MG PO TABS
650.0000 mg | ORAL_TABLET | ORAL | Status: DC | PRN
  Administered 2018-03-17 (×2): 650 mg via ORAL
  Filled 2018-03-14 (×2): qty 2

## 2018-03-14 MED ORDER — LIDOCAINE HCL (PF) 1 % IJ SOLN
30.0000 mL | INTRAMUSCULAR | Status: DC | PRN
  Filled 2018-03-14: qty 30

## 2018-03-14 MED ORDER — HYDROXYZINE HCL 10 MG PO TABS
10.0000 mg | ORAL_TABLET | Freq: Two times a day (BID) | ORAL | Status: DC | PRN
  Administered 2018-03-14 – 2018-03-15 (×2): 10 mg via ORAL
  Filled 2018-03-14 (×3): qty 1

## 2018-03-14 MED ORDER — ONDANSETRON HCL 4 MG/2ML IJ SOLN: 4.0000 mg | Freq: Four times a day (QID) | INTRAMUSCULAR | Status: DC | PRN

## 2018-03-14 NOTE — Progress Notes (Signed)
LABOR PROGRESS NOTE  Bonnie LundborgClaudia Gaytan Carey is a 37 y.o. G1P0 at 719w0d  admitted for IOL for cholestasis.   Subjective: Comfortable. Not feeling contractions.   Objective: BP 102/80 (BP Location: Right Arm)   Pulse 90   Temp 98.3 F (36.8 C) (Oral)   Resp 16   Ht 5' (1.524 m)   Wt 69 kg   LMP 06/28/2017   SpO2 97%   BMI 29.72 kg/m  or  Vitals:   03/14/18 1115 03/14/18 1255 03/14/18 1454 03/14/18 1605  BP: 113/64 122/68 102/80   Pulse: 91 82 90   Resp: 16 16 16 16   Temp: 99.1 F (37.3 C)  98.3 F (36.8 C)   TempSrc: Oral  Oral   SpO2: 97%     Weight:      Height:        Dilation: Fingertip Effacement (%): Thick Cervical Position: Posterior Station: -3 Presentation: Vertex Exam by:: MD Wallce FHT: baseline rate 145, moderate varibility, +acel, nodecel Toco: q1-3 min   Labs: Lab Results  Component Value Date   WBC 5.5 03/14/2018   HGB 10.1 (L) 03/14/2018   HCT 32.1 (L) 03/14/2018   MCV 87.5 03/14/2018   PLT 283 03/14/2018    Patient Active Problem List   Diagnosis Date Noted  . Supervision of high risk pregnancy, antepartum 02/08/2018  . Cholestasis during pregnancy in third trimester 02/08/2018  . Advanced maternal age, primigravida in first trimester, antepartum     Assessment / Plan: 37 y.o. G1P0 at 4719w0d here for IOL for cholestasis.   Labor: Induction. S/p cytotec x1. Will give 2nd cytotec despite frequent contractions as patient is comfortable and has made minimal cervical change. Cervix currently not favorable for FB placement.  Fetal Wellbeing:  Cat I  Pain Control:  Epidural upon maternal request, considering options  Anticipated MOD:  NSVD  Marcy Sirenatherine Wallace, D.O. OB Fellow  03/14/2018, 5:32 PM

## 2018-03-14 NOTE — H&P (Signed)
LABOR AND DELIVERY ADMISSION HISTORY AND PHYSICAL NOTE  Bonnie Carey is a 37 y.o. female G1P0 with IUP at 529w0d by LMP consistent with 13 wk sono presenting for IOL for cholestasis.  She reports positive fetal movement. She denies leakage of fluid or vaginal bleeding.  Prenatal History/Complications: [facility]  Pregnancy complications:  - Cholestasis  - AMA   Past Medical History: Past Medical History:  Diagnosis Date  . Kidney stones     Past Surgical History: Past Surgical History:  Procedure Laterality Date  . NO PAST SURGERIES      Obstetrical History: OB History    Gravida  1   Para      Term      Preterm      AB      Living  0     SAB      TAB      Ectopic      Multiple      Live Births              Social History: Social History   Socioeconomic History  . Marital status: Married    Spouse name: Not on file  . Number of children: Not on file  . Years of education: Not on file  . Highest education level: Not on file  Occupational History  . Not on file  Social Needs  . Financial resource strain: Not on file  . Food insecurity:    Worry: Not on file    Inability: Not on file  . Transportation needs:    Medical: Not on file    Non-medical: Not on file  Tobacco Use  . Smoking status: Never Smoker  . Smokeless tobacco: Never Used  Substance and Sexual Activity  . Alcohol use: Never    Frequency: Never  . Drug use: Never  . Sexual activity: Yes    Birth control/protection: None  Lifestyle  . Physical activity:    Days per week: Not on file    Minutes per session: Not on file  . Stress: Not on file  Relationships  . Social connections:    Talks on phone: Not on file    Gets together: Not on file    Attends religious service: Not on file    Active member of club or organization: Not on file    Attends meetings of clubs or organizations: Not on file    Relationship status: Not on file  Other  Topics Concern  . Not on file  Social History Narrative  . Not on file    Family History: History reviewed. No pertinent family history.  Allergies: No Known Allergies  Medications Prior to Admission  Medication Sig Dispense Refill Last Dose  . diphenhydrAMINE (BENADRYL) 2 % cream Apply topically 3 (three) times daily as needed for itching. 30 g 0 Past Week at Unknown time  . hydrOXYzine (ATARAX/VISTARIL) 25 MG tablet Take 1 tablet (25 mg total) by mouth every 4 (four) hours as needed for itching. 30 tablet 0 Past Week at Unknown time  . lidocaine (XYLOCAINE) 5 % ointment Apply 1 application topically as needed. (Patient taking differently: Apply 1 application topically as needed for mild pain. ) 35.44 g 0 Past Week at Unknown time  . Prenatal Multivit-Min-Fe-FA (PRENATAL VITAMINS PO) Take 1 tablet by mouth daily.    03/13/2018 at Unknown time  . ursodiol (ACTIGALL) 300 MG capsule Take 300 mg by mouth 3 (three) times  daily.   03/14/2018 at Unknown time     Review of Systems  All systems reviewed and negative except as stated in HPI  Physical Exam Blood pressure 102/80, pulse 90, temperature 98.3 F (36.8 C), temperature source Oral, resp. rate 16, height 5' (1.524 m), weight 69 kg, last menstrual period 06/28/2017, SpO2 97 %. General appearance: alert, oriented, NAD Lungs: normal respiratory effort Heart: regular rate Abdomen: soft, non-tender; gravid, FH appropriate for GA Extremities: No calf swelling or tenderness Presentation: cephalic Fetal monitoring: 145 bpm, mod variability, +acels, no decels  Uterine activity: q1-3 min  Dilation: Fingertip Effacement (%): Thick Station: -3 Exam by:: MD Vella Redhead  Prenatal labs: ABO, Rh: --/--/O POS, O POS Performed at [facility], [address] [place], [place] [address]  [phone]) Antibody: NEG (12/08 0845) Rubella: Immune (05/20 0000) RPR: Nonreactive (10/07 0000)  HBsAg:   Negative  HIV: Non-reactive (10/07 0000)   GC/Chlamydia: Negative  GBS:  Negative  2-hr GTT: Normal  Genetic screening:  Quad QNL  Anatomy US: Normal   Prenatal Transfer Tool  Maternal Diabetes: No Genetic Screening: Normal Maternal Ultrasounds/Referrals: Normal Fetal Ultrasounds or other Referrals:  None Maternal Substance Abuse:  No Significant Maternal Medications:  Meds include: Other: Ursodiol  Significant Maternal Lab Results: Lab values include: Group B Strep negative  Results for orders placed or performed during the hospital encounter of 03/14/18 (from the past 24 hour(s))  CBC   Collection Time: 03/14/18  8:45 AM  Result Value Ref Range   WBC 5.5 4.0 - 10.5 K/uL   RBC 3.67 (L) 3.87 - 5.11 MIL/uL   Hemoglobin 10.1 (L) 12.0 - 15.0 g/dL   HCT 11.9 (L) 14.7 - 82.9 %   MCV 87.5 80.0 - 100.0 fL   MCH 27.5 26.0 - 34.0 pg   MCHC 31.5 30.0 - 36.0 g/dL   RDW 56.2 13.0 - 86.5 %   Platelets 283 150 - 400 K/uL   nRBC 0.0 0.0 - 0.2 %  Type and screen [facility]   Collection Time: 03/14/18  8:45 AM  Result Value Ref Range   ABO/RH(D) O POS    Antibody Screen NEG    Sample Expiration      03/17/2018 Performed at [facility], [address], [place], [place] [address]   ABO/Rh   Collection Time: 03/14/18  8:45 AM  Result Value Ref Range   ABO/RH(D)      O POS Performed at [facility], [address] [place], [place] [address]     Patient Active Problem List   Diagnosis Date Noted  . Supervision of high risk pregnancy, antepartum 02/08/2018  . Cholestasis during pregnancy in third trimester 02/08/2018  . Advanced maternal age, primigravida in first trimester, antepartum     Assessment: Bonnie Carey is a 37 y.o. G1P0 at [redacted]w[redacted]d here for IOL for cholestasis.   #Labor: Induction. Start with cytotec.  #Pain: Patient considering epidural  #FWB: Cat I  #ID:  GBS neg  #MOF: Both  #MOC: Patch  #Circ:  Declines   De Hollingshead 03/14/2018, 5:30 PM

## 2018-03-14 NOTE — Anesthesia Pain Management Evaluation Note (Signed)
  CRNA Pain Management Visit Note  Patient: Bonnie LundborgClaudia Gaytan Ibarra, 37 y.o., female  "Hello I am a member of the anesthesia team at [facility]. We have an anesthesia team available at all times to provide care throughout the hospital, including epidural management and anesthesia for C-section. I don't know your plan for the delivery whether it a natural birth, water birth, IV sedation, nitrous supplementation, doula or epidural, but we want to meet your pain goals."   1.Was your pain managed to your expectations on prior hospitalizations?   No prior hospitalizations  2.What is your expectation for pain management during this hospitalization?     Epidural  3.How can we help you reach that goal?   Record the patient's initial score and the patient's pain goal.   Pain: 0  Pain Goal: 5 [facility] wants you to be able to say your pain was always managed very well.  Laban EmperorMalinova,Mckynna Vanloan Hristova 03/14/2018

## 2018-03-14 NOTE — Progress Notes (Signed)
Patient ID: Bonnie LundborgClaudia Gaytan Carey, female   DOB: October 22, 1980, 37 y.o.   MRN: [id number] FHR tracing reviewed There are recurrent mild decelerations, unclear whether late or variable, due to uterine irritability  Vitals:   03/14/18 1752 03/14/18 1917 03/14/18 2106 03/14/18 2211  BP: (!) 107/54 (!) 111/55 119/74 126/73  Pulse: 87 89 (!) 105 (!) 101  Resp:  16 17 18   Temp:   98.6 F (37 C)   TempSrc:   Oral   SpO2:      Weight:      Height:       Dilation: Fingertip Effacement (%): Thick Cervical Position: Posterior Station: -3 Presentation: Vertex Exam by:: MD Vella RedheadWallce  Will recheck her cervix, consider Foley

## 2018-03-15 LAB — RPR: RPR Ser Ql: NONREACTIVE

## 2018-03-15 MED ORDER — HYDROXYZINE HCL 50 MG PO TABS
25.0000 mg | ORAL_TABLET | Freq: Two times a day (BID) | ORAL | Status: DC | PRN
  Administered 2018-03-16: 50 mg via ORAL
  Filled 2018-03-15: qty 1

## 2018-03-15 MED ORDER — OXYTOCIN 40 UNITS IN LACTATED RINGERS INFUSION - SIMPLE MED: 1.0000 m[IU]/min | INTRAVENOUS | Status: DC

## 2018-03-15 MED ORDER — MISOPROSTOL 25 MCG QUARTER TABLET
25.0000 ug | ORAL_TABLET | ORAL | Status: DC | PRN
  Administered 2018-03-16 (×2): 25 ug via VAGINAL
  Filled 2018-03-15 (×3): qty 1

## 2018-03-15 MED ORDER — MISOPROSTOL 50MCG HALF TABLET
50.0000 ug | ORAL_TABLET | ORAL | Status: DC
  Administered 2018-03-15: 50 ug via BUCCAL

## 2018-03-15 MED ORDER — MISOPROSTOL 50MCG HALF TABLET
50.0000 ug | ORAL_TABLET | ORAL | Status: DC | PRN
  Administered 2018-03-15 (×3): 50 ug via ORAL
  Filled 2018-03-15 (×4): qty 1

## 2018-03-15 NOTE — Progress Notes (Signed)
OB/GYN Faculty Practice: Labor Progress Note  *Telephone interpreter used for visit* Subjective: Doing well. In room with husband. Feeling uncomfortable, complaining of LUQ pain and LLQ pain. Also complaining of pain in waist. States pain used to come and go but now more intense and not going away. Has rash over most of body, has been present for about 10 days. Very itchy.   Objective: BP (!) 129/57   Pulse 80   Temp 99.7 F (37.6 C) (Axillary)   Resp 18   Ht 5' (1.524 m)   Wt 69 kg   LMP 06/28/2017   SpO2 97%   BMI 29.72 kg/m  Gen: somewhat uncomfortable appearing  Dilation: 2 Effacement (%): Thick Cervical Position: Posterior Station: -3 Presentation: Vertex Exam by:: Earlene PlaterWallace, L., DO  Assessment and Plan: 37 y.o. G1P0 6369w1d here for IOL for cholestasis.   Labor: Induction started yesterday morning (12/8) in the morning with cytotec. FB was placed and came out around 0530 this morning but was only 2cm dilated internally, cervix has remained thick. On my exam, external os is 4cm, internal exam is 2cm and cervix remains thick. Abdomen distended - difficult to determine resting tone but soft, somewhat tender on palpation. Low suspicion for abruption in setting of increased resting tone as minimal risk factors, no vaginal bleeding, reassuring fetal tracing.  -- s/p FB x 2 (2nd Cook catheter just placed) -- cytotec x 5 (all buccal) - will trial vaginal cytotec now  -- pain control: will trial IV fentanyl  -- PPH Risk: moderate given long induction  -- will recheck CMP, CBC given abdominal pain  Fetal Well-Being: EFW 6-7lbs by Leopolds. Cephalic by sutures.  -- Category I - continuous fetal monitoring  -- GBS negative    Malachai Schalk S. Earlene PlaterWallace, DO OB/GYN Fellow, Faculty Practice  11:36 PM

## 2018-03-15 NOTE — Progress Notes (Signed)
Spanish interpreter [phone] used at time of foley bulb placement.

## 2018-03-15 NOTE — Progress Notes (Signed)
LABOR PROGRESS NOTE  Bonnie LundborgClaudia Gaytan Carey is a 37 y.o. G1P0 at 780w1d  admitted for IOL due to cholestasis.  Subjective: Resting comfortably. Some discomfort during foley placement attempt. 60s decel to the 90s with recovery to baseline observed with obvious trigger.   Objective: BP (!) 110/55   Pulse 82   Temp 98.5 F (36.9 C) (Oral)   Resp 18   Ht 5' (1.524 m)   Wt 69 kg   LMP 06/28/2017   SpO2 97%   BMI 29.72 kg/m  or  Vitals:   03/15/18 0533 03/15/18 0813 03/15/18 0814 03/15/18 0946  BP: 115/63  115/65 (!) 110/55  Pulse: 75  88 82  Resp: 17 18  18   Temp: 98.4 F (36.9 C) 98.4 F (36.9 C)  98.5 F (36.9 C)  TempSrc: Oral Oral  Oral  SpO2:      Weight:      Height:        ~1000 Dilation: 3.5 Effacement (%): Thick Cervical Position: Posterior Station: -3 Presentation: Vertex Exam by:: Dr. Darlin DropPhilip  FHT: baseline rate 140s, moderate variability, no acel, no decel Toco: No cx present  Labs: Lab Results  Component Value Date   WBC 5.5 03/14/2018   HGB 10.1 (L) 03/14/2018   HCT 32.1 (L) 03/14/2018   MCV 87.5 03/14/2018   PLT 283 03/14/2018    Patient Active Problem List   Diagnosis Date Noted  . Supervision of high risk pregnancy, antepartum 02/08/2018  . Cholestasis during pregnancy in third trimester 02/08/2018  . Advanced maternal age, primigravida in first trimester, antepartum     Assessment / Plan: 37 y.o. G1P0 at 7180w1d here for IOL due to cholestasis.  Labor: Not in active labor. Failed foley placement x2. Continue misoprostol buccal. Fetal Wellbeing:  Tolerating well, continue to monitor due to recent decel. Pain Control:  None required at this time. Anticipated MOD:  Vaginal  Wendie AgresteSarah Asman Ciel Yanes, MD PGY-1 Family Medicine Resident 03/15/2018, 11:13 AM

## 2018-03-15 NOTE — Progress Notes (Signed)
Patient ID: [name], female   DOB: 12-25-1980, 37 y.o.   MRN: [id number] Doing well  Vitals:   03/14/18 2211 03/14/18 2319 03/14/18 2320 03/14/18 2321  BP: 126/73 118/62 118/62   Pulse: (!) 101 82 82   Resp: 18  18   Temp:    99.3 F (37.4 C)  TempSrc:    Oral  SpO2:      Weight:      Height:       FHR reactive, average variability, good accels Uterine irritability  Dilation: Fingertip Effacement (%): Thick Cervical Position: Posterior Station: -3 Presentation: Vertex Exam by:: MD Wallce  Will observe.  Foley when able

## 2018-03-15 NOTE — Progress Notes (Signed)
Patient ID: Bonnie Carey, female   DOB: 05-21-1980, 37 y.o.   MRN: [id number] Vitals:   03/14/18 2321 03/15/18 0148 03/15/18 0149 03/15/18 0242  BP:   116/65 115/66  Pulse:   83 76  Resp:   16 18  Temp: 99.3 F (37.4 C) 99.4 F (37.4 C)    TempSrc: Oral Oral    SpO2:      Weight:      Height:       FHR stable  Dilation: 1 Effacement (%): Thick Cervical Position: Posterior Station: Ballotable Presentation: Vertex Exam by:: Mayford KnifeWilliams, CNM  Foley inserted with some difficulty Cervix extremely friable Finally succeeded with Speculum but foley would not advance fully, so bulb only inflated with 30cc  Will observe Is contracting frequently so will hold on Pitocin

## 2018-03-15 NOTE — Progress Notes (Signed)
Patient ID: [name], female   DOB: 1980-05-26, 37 y.o.   MRN: [id number] Foley came out.  Cervix still long/2cm  Will restart Cytotec orally 50mcg q4hrs  FHR reactive UCs every 2-423min  Vitals:   03/15/18 0149 03/15/18 0242 03/15/18 0407 03/15/18 0533  BP: 116/65 115/66 (!) 93/45 115/63  Pulse: 83 76 79 75  Resp: 16 18 16 17   Temp:      TempSrc:      SpO2:      Weight:      Height:

## 2018-03-15 NOTE — Progress Notes (Signed)
LABOR PROGRESS NOTE  Clyde LundborgClaudia Gaytan Ibarra is a 37 y.o. G1P0 at 7041w1d  admitted for IOL due to cholestasis.  Subjective: Resting comfortably in bed on her side.  Objective: BP (!) 110/53   Pulse 90   Temp 98.7 F (37.1 C) (Oral)   Resp 18   Ht 5' (1.524 m)   Wt 69 kg   LMP 06/28/2017   SpO2 97%   BMI 29.72 kg/m  or  Vitals:   03/15/18 1256 03/15/18 1257 03/15/18 1423 03/15/18 1424  BP:  (!) 104/50 (!) 110/53 (!) 110/53  Pulse:  81 90 90  Resp: 18     Temp: 98.9 F (37.2 C)   98.7 F (37.1 C)  TempSrc: Oral   Oral  SpO2:      Weight:      Height:        ~1400 Dilation: 2 Effacement (%): Thick Cervical Position: Posterior Station: -3 Presentation: Vertex Exam by:: Sandy SalaamLynnsey Johnson, RN FHT: baseline rate 140s, moderate variability, +accels, intermittent decels to 90s Toco: Irregular cx q2-536min  Labs: Lab Results  Component Value Date   WBC 5.5 03/14/2018   HGB 10.1 (L) 03/14/2018   HCT 32.1 (L) 03/14/2018   MCV 87.5 03/14/2018   PLT 283 03/14/2018    Patient Active Problem List   Diagnosis Date Noted  . Supervision of high risk pregnancy, antepartum 02/08/2018  . Cholestasis during pregnancy in third trimester 02/08/2018  . Advanced maternal age, primigravida in first trimester, antepartum     Assessment / Plan: 37 y.o. G1P0 at 6241w1d here for IOL due to cholestasis.  Labor: Not in active labor. Failed foley placement x2. Continue misoprostol buccal. Fetal Wellbeing:  Occasional prolonged decels. Continue to monitor. Pain Control:  None required at this time. Anticipated MOD:  Vaginal  Wendie AgresteSarah Asman Briannia Laba, MD PGY-1 Family Medicine Resident 03/15/2018, 3:27 PM

## 2018-03-16 LAB — COMPREHENSIVE METABOLIC PANEL
ALT: 18 U/L (ref 0–44)
AST: 23 U/L (ref 15–41)
Albumin: 2.6 g/dL — ABNORMAL LOW (ref 3.5–5.0)
Alkaline Phosphatase: 157 U/L — ABNORMAL HIGH (ref 38–126)
Anion gap: 8 (ref 5–15)
BUN: 11 mg/dL (ref 6–20)
CO2: 20 mmol/L — ABNORMAL LOW (ref 22–32)
Calcium: 8.1 mg/dL — ABNORMAL LOW (ref 8.9–10.3)
Chloride: 107 mmol/L (ref 98–111)
Creatinine, Ser: 0.6 mg/dL (ref 0.44–1.00)
GFR calc Af Amer: >60 mL/min (ref >60)
Glucose, Bld: 84 mg/dL (ref 70–99)
Potassium: 3.6 mmol/L (ref 3.5–5.1)
Sodium: 135 mmol/L (ref 135–145)
Total Bilirubin: 0.5 mg/dL (ref 0.3–1.2)
Total Protein: 6 g/dL — ABNORMAL LOW (ref 6.5–8.1)

## 2018-03-16 LAB — CBC
HCT: 31.9 % — ABNORMAL LOW (ref 36.0–46.0)
HEMOGLOBIN: 10.1 g/dL — AB (ref 12.0–15.0)
MCH: 27.4 pg (ref 26.0–34.0)
MCHC: 31.7 g/dL (ref 30.0–36.0)
MCV: 86.4 fL (ref 80.0–100.0)
Platelets: 261 10*3/uL (ref 150–400)
RBC: 3.69 MIL/uL — ABNORMAL LOW (ref 3.87–5.11)
RDW: 15 % (ref 11.5–15.5)
WBC: 7.6 10*3/uL (ref 4.0–10.5)
nRBC: 0 % (ref 0.0–0.2)

## 2018-03-16 MED ORDER — PHENYLEPHRINE 40 MCG/ML (10ML) SYRINGE FOR IV PUSH (FOR BLOOD PRESSURE SUPPORT): 80.0000 ug | PREFILLED_SYRINGE | INTRAVENOUS | Status: DC | PRN

## 2018-03-16 MED ORDER — LACTATED RINGERS IV SOLN
500.0000 mL | Freq: Once | INTRAVENOUS | Status: AC
  Administered 2018-03-17: 500 mL via INTRAVENOUS

## 2018-03-16 MED ORDER — PHENYLEPHRINE 40 MCG/ML (10ML) SYRINGE FOR IV PUSH (FOR BLOOD PRESSURE SUPPORT)
80.0000 ug | PREFILLED_SYRINGE | INTRAVENOUS | Status: DC | PRN
  Filled 2018-03-16 (×2): qty 10

## 2018-03-16 MED ORDER — EPHEDRINE 5 MG/ML INJ: 10.0000 mg | INTRAVENOUS | Status: DC | PRN

## 2018-03-16 MED ORDER — FENTANYL 2.5 MCG/ML BUPIVACAINE 1/10 % EPIDURAL INFUSION (WH - ANES)
14.0000 mL/h | INTRAMUSCULAR | Status: DC | PRN
  Administered 2018-03-17 – 2018-03-18 (×5): 14 mL/h via EPIDURAL
  Filled 2018-03-16 (×5): qty 100

## 2018-03-16 MED ORDER — DIPHENHYDRAMINE HCL 50 MG/ML IJ SOLN: 12.5000 mg | INTRAMUSCULAR | Status: DC | PRN

## 2018-03-16 MED ORDER — OXYTOCIN 40 UNITS IN LACTATED RINGERS INFUSION - SIMPLE MED
1.0000 m[IU]/min | INTRAVENOUS | Status: DC
  Administered 2018-03-16: 2 m[IU]/min via INTRAVENOUS
  Administered 2018-03-17: 7 m[IU]/min via INTRAVENOUS
  Administered 2018-03-18: 36 m[IU]/min via INTRAVENOUS
  Filled 2018-03-16: qty 1000

## 2018-03-16 MED ORDER — LACTATED RINGERS IV SOLN: 500.0000 mL | Freq: Once | INTRAVENOUS | Status: DC

## 2018-03-16 NOTE — Progress Notes (Signed)
Bonnie PierClaudia Gaytan Ibarrais a 37 y.o.G1P0 at 6746w2d admitted for IOL for Cholestasis on 12/8 in the am. S/p cyto X7 and s/p 2 foleys. Currently on pitocin 3610mu/min   Subjective:  Doing well, minimal cramping per patient   Objective: BP (!) 98/44   Pulse 80   Temp 98.7 F (37.1 C) (Oral)   Resp 18   Ht 5' (1.524 m)   Wt 69 kg   LMP 06/28/2017   SpO2 98%   BMI 29.72 kg/m  No intake/output data recorded. No intake/output data recorded.  FHT:  FHR: 140 bpm, variability: moderate,  accelerations:  Abscent,  decelerations:  Present late decelerations, late onset variables UC:   irregular, every 1-3 minutes SVE:  1930 Dilation: 4 Effacement (%): 70 Station: -2 Exam by:: Student midwife  No change in edematous abdomen and lower extremities   Labs: Lab Results  Component Value Date   WBC 7.6 03/15/2018   HGB 10.1 (L) 03/15/2018   HCT 31.9 (L) 03/15/2018   MCV 86.4 03/15/2018   PLT 261 03/15/2018    Assessment / Plan:  G1 37w/2d IOL for cholestasis  Labor:  Latent labor, patient reports minimal contractions continue to titrate pitocin as long as fetus tolerates   Preeclampsia:  labs stable Fetal Wellbeing: category 2 Pain Control:  Patient has declined pain management, reports that she is coping well   I/D:  n/a Anticipated MOD:  NSVD   Sigurd SosStephenia M Terrion Poblano 03/16/2018, 7:46 PM

## 2018-03-16 NOTE — Anesthesia Preprocedure Evaluation (Addendum)
Anesthesia Evaluation  Patient identified by MRN, date of birth, ID band Patient awake    Reviewed: Allergy & Precautions, NPO status , Patient's Chart, lab work & pertinent test results  Airway Mallampati: II  TM Distance: >3 FB Neck ROM: Full    Dental no notable dental hx. (+) Teeth Intact   Pulmonary neg pulmonary ROS,    Pulmonary exam normal breath sounds clear to auscultation       Cardiovascular negative cardio ROS Normal cardiovascular exam Rhythm:Regular Rate:Normal     Neuro/Psych negative neurological ROS  negative psych ROS   GI/Hepatic negative GI ROS, Neg liver ROS, Hx of cholestasis- reason for induction   Endo/Other    Renal/GU      Musculoskeletal   Abdominal   Peds  Hematology  (+) anemia ,   Anesthesia Other Findings Pt is spanish speaking hx taken w laguage line  Pt has bilateral vesicular papular rash on upper exrtrenities w redness . Not warm to touch ? Secondary to cholestasis.  Prospective epidural site nL   Reproductive/Obstetrics (+) Pregnancy                          Lab Results  Component Value Date   WBC 7.6 03/15/2018   HGB 10.1 (L) 03/15/2018   HCT 31.9 (L) 03/15/2018   MCV 86.4 03/15/2018   PLT 261 03/15/2018    Anesthesia Physical Anesthesia Plan  ASA: II  Anesthesia Plan: Epidural   Post-op Pain Management:    Induction:   PONV Risk Score and Plan:   Airway Management Planned:   Additional Equipment:   Intra-op Plan:   Post-operative Plan:   Informed Consent: I have reviewed the patients History and Physical, chart, labs and discussed the procedure including the risks, benefits and alternatives for the proposed anesthesia with the patient or authorized representative who has indicated his/her understanding and acceptance.     Plan Discussed with:   Anesthesia Plan Comments:         Anesthesia Quick Evaluation

## 2018-03-16 NOTE — Progress Notes (Signed)
OB/GYN Faculty Practice: Labor Progress Note  Subjective:  Strip note. Discussed plan of care with RN. Pain is doing a little bit better and itching seems to be improving, Fentanyl was helpful. FB still in place, on gentle traction.   Objective: BP (!) 113/56 (BP Location: Left Arm)   Pulse 90   Temp 99 F (37.2 C) (Oral)   Resp 16   Ht 5' (1.524 m)   Wt 69 kg   LMP 06/28/2017   SpO2 97%   BMI 29.72 kg/m  Gen: strip note Dilation: 2 Effacement (%): Thick Cervical Position: Posterior Station: -3 Presentation: Vertex Exam by:: Earlene PlaterWallace, L., DO  Assessment and Plan: 37 y.o. G1P0 3380w1d here for IOL for cholestasis.   Labor: Induction started 12/8 morning with cytotec. First FB not completely through internal os, 2nd FB placed around midnight. Still in place. Labs reassuring when last checked.  -- cytotec x 8  -- pain control: fentanyl  -- PPH Risk: moderate given long induction   Fetal Well-Being: EFW 6-7lbs by Leopolds. Cephalic by sutures.  -- Category I - continuous fetal monitoring  -- GBS negative    Stratton Villwock S. Earlene PlaterWallace, DO OB/GYN Fellow, Faculty Practice  9:11 AM

## 2018-03-16 NOTE — Progress Notes (Addendum)
Bonnie LundborgClaudia Gaytan Carey is a 37 y.o. G1P0 at 1224w2d admitted for IOL for Cholestasis on 12/8 in the am. S/p cyto X7 and 2 foleys.   Subjective:  Doing well, feeling some contractions but overall coping well.   Objective: BP (!) 94/48 (BP Location: Left Arm)   Pulse 83   Temp 99 F (37.2 C) (Oral)   Resp 16   Ht 5' (1.524 m)   Wt 69 kg   LMP 06/28/2017   SpO2 97%   BMI 29.72 kg/m  No intake/output data recorded. No intake/output data recorded.  FHT:  FHR: 140 bpm, variability: moderate,  accelerations:  Present,  decelerations:  Absent UC:   irregular, every 2-5  minutes  Last Cytotec 12/10 at 0449  SVE at 1115: foley bulb out. Dilation: 4, 80% effaced and -2 station.   PUPPS: rash on arms and abdomen. Pt taking vistaril prn   Labs: Lab Results  Component Value Date   WBC 7.6 03/15/2018   HGB 10.1 (L) 03/15/2018   HCT 31.9 (L) 03/15/2018   MCV 86.4 03/15/2018   PLT 261 03/15/2018    Assessment / Plan:  G1 37w/2d IOL for cholestasis  Labor: s/p cervical foley. Plan is to start pitocin  Preeclampsia:  labs stable Fetal Wellbeing:  Category I Pain Control:  IV pain meds until patient desires epidural  I/D:  n/a Anticipated MOD:  NSVD  Bonnie Carey 03/16/2018, 10:29 AM

## 2018-03-16 NOTE — Progress Notes (Signed)
LABOR PROGRESS NOTE  Bonnie LundborgClaudia Gaytan Carey is a 37 y.o. G1P0 at 2162w2d admitted for IOL for cholestasis   Subjective: Feeling more pain with contractions Not quite ready for epidural, but considering it soon Discussed AROM following epidural placement  Objective: BP (!) 111/51   Pulse 84   Temp 99 F (37.2 C) (Oral)   Resp 18   Ht 5' (1.524 m)   Wt 69 kg   LMP 06/28/2017   SpO2 98%   BMI 29.72 kg/m  or  Vitals:   03/16/18 2101 03/16/18 2131 03/16/18 2201 03/16/18 2224  BP: 108/61 (!) 99/40 (!) 111/51   Pulse: 89 96 84   Resp:  18    Temp:    99 F (37.2 C)  TempSrc:    Oral  SpO2:      Weight:      Height:        Dilation: 4 Effacement (%): 70 Cervical Position: Posterior Station: -2 Presentation: Vertex Exam by:: Student midwife FHT: baseline rate 150, moderate varibility, + acel, variable decel Toco: regular contractions, but difficult to trace   Labs: Lab Results  Component Value Date   WBC 7.6 03/15/2018   HGB 10.1 (L) 03/15/2018   HCT 31.9 (L) 03/15/2018   MCV 86.4 03/15/2018   PLT 261 03/15/2018    Patient Active Problem List   Diagnosis Date Noted  . Supervision of high risk pregnancy, antepartum 02/08/2018  . Cholestasis during pregnancy in third trimester 02/08/2018  . Advanced maternal age, primigravida in first trimester, antepartum     Assessment / Plan: 37 y.o. G1P0 at 9062w2d here for IOL for cholestasis   Labor: continue pitocin. Will AROM following epidural  Fetal Wellbeing:  Cat 2 strip  Pain Control:  Will likely request epidural soon  Anticipated MOD:  SVD  Bonnie AbbotNimeka Zohar Laing, MD  OB Fellow  03/16/2018, 10:25 PM

## 2018-03-16 NOTE — Progress Notes (Addendum)
Bonnie LundborgClaudia Gaytan Carey is a 37 y.o. G1P0 at 7326w2d admitted for IOL for Cholestasis on 12/8 in the am. S/p cyto X7 and s/p 2 foleys. Currently on pitocin 648mu/min  Subjective:  Doing well, patient reports occasional lower back pain  Objective: BP 109/63   Pulse 84   Temp 98.6 F (37 C) (Oral)   Resp 16   Ht 5' (1.524 m)   Wt 69 kg   LMP 06/28/2017   SpO2 97%   BMI 29.72 kg/m  No intake/output data recorded. No intake/output data recorded.  FHT:  FHR: 145 bpm, variability: moderate,  accelerations:  Present,  decelerations:  Present variable UC:   irregular, every 1-5 minutes.  SVE: 1115   Dilation: 4 Effacement (%): 80 Station: -2 Exam by:: CNM Bonnie Carey  Abdomen noted to be edematous. Patient denies tenderness with palpation. Denies abdominal pain, +2 pitting edema on lower extremities.    Labs: Lab Results  Component Value Date   WBC 7.6 03/15/2018   HGB 10.1 (L) 03/15/2018   HCT 31.9 (L) 03/15/2018   MCV 86.4 03/15/2018   PLT 261 03/15/2018    Assessment / Plan: G1 37w/2d IOL for cholestasis   Labor:  Latent labor, patient reports occ. lower back pain however denies regular contractions, continue to titrate pitocin  Preeclampsia:  labs stable Fetal Wellbeing: category 2 Pain Control:  Patient has declined pain management, reports that she is coping well with lower back cramping  I/D:  n/a Anticipated MOD:  NSVD  Bonnie Carey 03/16/2018, 3:53 PM

## 2018-03-17 ENCOUNTER — Encounter: Payer: Self-pay | Admitting: Obstetrics and Gynecology

## 2018-03-17 ENCOUNTER — Inpatient Hospital Stay (HOSPITAL_COMMUNITY): Payer: Medicaid Other | Admitting: Anesthesiology

## 2018-03-17 ENCOUNTER — Other Ambulatory Visit: Payer: Self-pay

## 2018-03-17 ENCOUNTER — Inpatient Hospital Stay (HOSPITAL_COMMUNITY): Payer: Medicaid Other

## 2018-03-17 LAB — COMPREHENSIVE METABOLIC PANEL
ALT: 17 U/L (ref 0–44)
AST: 25 U/L (ref 15–41)
Albumin: 2.2 g/dL — ABNORMAL LOW (ref 3.5–5.0)
Alkaline Phosphatase: 147 U/L — ABNORMAL HIGH (ref 38–126)
Anion gap: 10 (ref 5–15)
BUN: 13 mg/dL (ref 6–20)
CALCIUM: 7.2 mg/dL — AB (ref 8.9–10.3)
CHLORIDE: 107 mmol/L (ref 98–111)
CO2: 18 mmol/L — ABNORMAL LOW (ref 22–32)
Creatinine, Ser: 1.07 mg/dL — ABNORMAL HIGH (ref 0.44–1.00)
GFR calc Af Amer: >60 mL/min (ref >60)
GFR calc non Af Amer: >60 mL/min (ref >60)
Glucose, Bld: 93 mg/dL (ref 70–99)
Potassium: 3.2 mmol/L — ABNORMAL LOW (ref 3.5–5.1)
SODIUM: 135 mmol/L (ref 135–145)
Total Bilirubin: 0.6 mg/dL (ref 0.3–1.2)
Total Protein: 5.3 g/dL — ABNORMAL LOW (ref 6.5–8.1)

## 2018-03-17 LAB — CBC
HEMATOCRIT: 29.2 % — AB (ref 36.0–46.0)
Hemoglobin: 9.2 g/dL — ABNORMAL LOW (ref 12.0–15.0)
MCH: 27.2 pg (ref 26.0–34.0)
MCHC: 31.5 g/dL (ref 30.0–36.0)
MCV: 86.4 fL (ref 80.0–100.0)
Platelets: 228 10*3/uL (ref 150–400)
RBC: 3.38 MIL/uL — AB (ref 3.87–5.11)
RDW: 15.2 % (ref 11.5–15.5)
WBC: 10.4 10*3/uL (ref 4.0–10.5)
nRBC: 0.3 % — ABNORMAL HIGH (ref 0.0–0.2)

## 2018-03-17 MED ORDER — SODIUM BICARBONATE 8.4 % IV SOLN
INTRAVENOUS | Status: DC | PRN
  Administered 2018-03-17: 5 mL via EPIDURAL
  Administered 2018-03-18: 10 mL via EPIDURAL

## 2018-03-17 MED ORDER — GENTAMICIN SULFATE 40 MG/ML IJ SOLN
5.0000 mg/kg | INTRAVENOUS | Status: DC
  Administered 2018-03-17: 270 mg via INTRAVENOUS
  Filled 2018-03-17 (×2): qty 6.75

## 2018-03-17 MED ORDER — SODIUM CHLORIDE 0.9 % IV SOLN
2.0000 g | Freq: Four times a day (QID) | INTRAVENOUS | Status: DC
  Administered 2018-03-17 (×2): 2 g via INTRAVENOUS
  Filled 2018-03-17: qty 2000
  Filled 2018-03-17: qty 2
  Filled 2018-03-17: qty 2000
  Filled 2018-03-17: qty 2

## 2018-03-17 MED ORDER — FENTANYL CITRATE (PF) 100 MCG/2ML IJ SOLN
INTRAMUSCULAR | Status: DC | PRN
  Administered 2018-03-17: 100 ug via EPIDURAL

## 2018-03-17 MED ORDER — FUROSEMIDE 10 MG/ML IJ SOLN
20.0000 mg | Freq: Once | INTRAMUSCULAR | Status: AC
  Administered 2018-03-17: 20 mg via INTRAVENOUS
  Filled 2018-03-17: qty 2

## 2018-03-17 MED ORDER — HYDROXYZINE HCL 50 MG PO TABS
25.0000 mg | ORAL_TABLET | Freq: Once | ORAL | Status: AC
  Administered 2018-03-17: 25 mg via ORAL
  Filled 2018-03-17: qty 1

## 2018-03-17 MED ORDER — CLINDAMYCIN PHOSPHATE 900 MG/50ML IV SOLN
900.0000 mg | INTRAVENOUS | Status: DC
  Administered 2018-03-18: 900 mg via INTRAVENOUS
  Filled 2018-03-17 (×2): qty 50

## 2018-03-17 MED ORDER — ACETAMINOPHEN 500 MG PO TABS
1000.0000 mg | ORAL_TABLET | Freq: Once | ORAL | Status: AC
  Administered 2018-03-17: 1000 mg via ORAL
  Filled 2018-03-17: qty 2

## 2018-03-17 MED ORDER — LIDOCAINE HCL (PF) 1 % IJ SOLN
INTRAMUSCULAR | Status: DC | PRN
  Administered 2018-03-17: 5 mL via EPIDURAL

## 2018-03-17 NOTE — Anesthesia Procedure Notes (Addendum)
Epidural Patient location during procedure: OB Start time: 03/17/2018 12:51 AM End time: 03/17/2018 1:24 AM  Staffing Anesthesiologist: Trevor IhaHouser, Stephen A, MD Performed: anesthesiologist   Preanesthetic Checklist Completed: patient identified, site marked, surgical consent, pre-op evaluation, timeout performed, IV checked, risks and benefits discussed and monitors and equipment checked  Epidural Patient position: sitting Prep: site prepped and draped and DuraPrep Patient monitoring: continuous pulse ox and blood pressure Approach: midline Location: L3-L4 Injection technique: LOR air  Needle:  Needle type: Tuohy  Needle gauge: 17 G Needle length: 9 cm and 9 Needle insertion depth: 7 cm Catheter type: closed end flexible Catheter size: 19 Gauge Catheter at skin depth: 12 cm Test dose: negative  Assessment Events: blood not aspirated, injection not painful, no injection resistance, negative IV test and no paresthesia  Additional Notes Patient identified. Risks/Benefits/Options discussed with patient including but not limited to bleeding, infection, nerve damage, paralysis, failed block, incomplete pain control, headache, blood pressure changes, nausea, vomiting, reactions to medication both or allergic, itching and postpartum back pain. Confirmed with bedside nurse the patient's most recent platelet count. Confirmed with patient that they are not currently taking any anticoagulation, have any bleeding history or any family history of bleeding disorders. Patient expressed understanding and wished to proceed. All questions were answered. Sterile technique was used throughout the entire procedure. Please see nursing notes for vital signs. Test dose was given through epidural needle and negative prior to continuing to dose epidural or start infusion. Warning signs of high block given to the patient including shortness of breath, tingling/numbness in hands, complete motor block, or any  concerning symptoms with instructions to call for help. Patient was given instructions on fall risk and not to get out of bed. All questions and concerns addressed with instructions to call with any issues. 1  Attempt (S) . Patient tolerated procedure well.

## 2018-03-17 NOTE — Progress Notes (Signed)
OB/[facility]N Faculty Practice: Labor Progress Note  Subjective: Called to room due to desaturation to 80s on RA with SOB. Patient in low 90s with non-rebreather. Noted on evening CMP to have creatinine bump with concern at this time for pulmonary edema.   Objective: BP 139/71   Pulse (!) 118   Temp 99.2 F (37.3 C) (Oral)   Resp 18   Ht 5' (1.524 m)   Wt 69 kg   LMP 06/28/2017   SpO2 90%   BMI 29.72 kg/m  Gen: tired appearing, NAD Dilation: Lip/rim Effacement (%): 100 Cervical Position: Anterior Station: 0 Presentation: Vertex Exam by:: K Faucett RN  Assessment and Plan: 37 y.o. G1P0 7665w1d here for IOL for cholestasis.   Suspect Pulmonary Edema: in setting of edema with Cr increase --Minimal urine output, third spacing.  --CXR and 20 mg Lasix ordered  Labor: Induction started 12/8 morning with cytotec. Multiple doses of cytotec then transitioned to pitocin. AROM 12/11 0200, clear fluid.  --Anterior lip at most recent check, anticipate beginning pushing after CXR -- continue to titrate pitocin (started 12/10 at 1130) - even though more than 24 hours will continue at current rate because making change, MVUs 110s -- pain control: epidural  -- PPH Risk: moderate given long induction   Fetal Well-Being: EFW 6-7lbs by Leopolds. Cephalic by sutures.  -- Category I - continuous fetal monitoring - occasionally Cat II with decels but responds well to scalp stim and position changes most recently, has needed 1 dose of terb and several fluid boluses/pit breaks  -- GBS negative    Triple I: Fever of 100.4 with occasional decels, no maternal or fetal tachycardia.  -- amp/gent -- Tylenol for fever and HA   Wendie AgresteSarah Asman Stasha Naraine, MD PGY-1 Family Medicine Intern 9:58 PM

## 2018-03-17 NOTE — Progress Notes (Signed)
[facility]N Faculty [facility]: Labor Progress Note  Subjective: Into room with RN to recheck. Still some shivering, headache.   Objective: BP 125/64   Pulse (!) 106   Temp 100.2 F (37.9 C) (Axillary)   Resp 18   Ht 5' (1.524 m)   Wt 69 kg   LMP 06/28/2017   SpO2 100%   BMI 29.72 kg/m  Gen: tired appearing, NAD Dilation: 7 Effacement (%): 80 Cervical Position: Anterior Station: 0 Presentation: Vertex Exam by:: Dr. Earlene PlaterWallace  Assessment and Plan: 37 y.o. G1P0 3467w1d here for IOL for cholestasis.   Labor: Induction started 12/8 morning with cytotec. Multiple doses of cytotec then transitioned to pitocin. AROM overnight at 0200, clear fluid. Minimal urine output, third spacing.  -- continue to titrate pitocin (started 12/10 at 1130) - even though more than 24 hours will continue at current rate because making change, MVUs 110s -- pain control: epidural  -- PPH Risk: moderate given long induction   Fetal Well-Being: EFW 6-7lbs by Leopolds. Cephalic by sutures.  -- Category I - continuous fetal monitoring - occasionally Cat II with decels but responds well to scalp stim and position changes most recently, has needed 1 dose of terb and several fluid boluses/pit breaks  -- GBS negative    Triple I: Fever of 100.4 with occasional decels, no maternal or fetal tachycardia.  -- amp/gent -- Tylenol for fever and HA   Laurel S. Earlene PlaterWallace, DO OB/GYN Fellow, Faculty Practice  6:10 PM

## 2018-03-17 NOTE — Progress Notes (Signed)
OB/[facility]N Faculty Practice: Labor Progress Note  Subjective: Chills, some pain with checks. Into room because of recent prolonged decel which required fluid bolus, repositioning, and oxygen.   Objective: BP (!) 123/93   Pulse (!) 109   Temp 99 F (37.2 C) (Axillary)   Resp 18   Ht 5' (1.524 m)   Wt 69 kg   LMP 06/28/2017   SpO2 100%   BMI 29.72 kg/m  Gen: shivering, appears comfortable but uncomfortable with check Dilation: 5.5 Effacement (%): 90 Cervical Position: Anterior Station: -1 Presentation: Vertex Exam by:: Dr. Earlene PlaterWallace  Assessment and Plan: 37 y.o. G1P0 5072w1d here for IOL for cholestasis.   Labor: Induction started 12/8 morning with cytotec. Multiple doses of cytotec then transitioned to pitocin about 24 hours ago. AROM overnight at 0200, clear fluid. Minimal urine output, third spacing. IUPC no longer working well, replaced.  -- continue to titrate pitocin (started 12/10 at 1130) - has been stopped twice for about 45 minutes each -- pain control: epidural  -- PPH Risk: moderate given long induction   Fetal Well-Being: EFW 6-7lbs by Leopolds. Cephalic by sutures.  -- Category I - continuous fetal monitoring - placed FSE to help with tracing  -- GBS negative    Laurel S. Earlene PlaterWallace, DO OB/GYN Fellow, Faculty Practice  1:26 PM

## 2018-03-17 NOTE — Progress Notes (Signed)
LABOR PROGRESS NOTE  Bonnie LundborgClaudia Gaytan Carey is a [age] y.o. G1P0 at 7540w3d admitted for IOL for cholestasis   Subjective: Feeling more comfortable with epidural Agreeable to AROM   Objective: BP 114/60   Pulse 81   Temp 98.9 F (37.2 C) (Oral)   Resp 16   Ht 5' (1.524 m)   Wt 69 kg   LMP 06/28/2017   SpO2 97%   BMI 29.72 kg/m  or  Vitals:   03/17/18 0136 03/17/18 0141 03/17/18 0146 03/17/18 0151  BP: 105/60 (!) 107/59 111/60 114/60  Pulse: 78 76 73 81  Resp:      Temp:      TempSrc:      SpO2: 98% 98% 97% 97%  Weight:      Height:        Dilation: 5 Effacement (%): 70 Cervical Position: Posterior Station: -2 Presentation: Vertex Exam by:: Gwenevere AbbotNimeka Carissa Musick, MD  FHT: baseline rate 150s, moderate varibility, + acel, no decel Toco: regular contractions, but difficult to trace or palpate  Labs: Lab Results  Component Value Date   WBC 7.6 03/15/2018   HGB 10.1 (L) 03/15/2018   HCT 31.9 (L) 03/15/2018   MCV 86.4 03/15/2018   PLT 261 03/15/2018    Patient Active Problem List   Diagnosis Date Noted  . Supervision of high risk pregnancy, antepartum 02/08/2018  . Cholestasis during pregnancy in third trimester 02/08/2018  . Advanced maternal age, primigravida in first trimester, antepartum     Assessment / Plan: 37 y.o. G1P0 at 5640w3d here for IOL for cholestasis   Labor: AROM + IUPC now. Continue pitocin  Fetal Wellbeing:  Cat 2 (reassuring) Pain Control:  Epidural in place working well  Anticipated MOD:  SVD  Gwenevere AbbotNimeka Cha Gomillion, MD OB Fellow  03/17/2018, 2:07 AM

## 2018-03-17 NOTE — Progress Notes (Signed)
OB/GYN Faculty Practice: Labor Progress Note  *In-person Spanish interpreter used for interaction.  Subjective: Doing well, epidural in place. Husband at bedside. No longer having pain, went away with epidural.   Objective: BP 102/68   Pulse 86   Temp 98 F (36.7 C) (Oral)   Resp 17   Ht 5' (1.524 m)   Wt 69 kg   LMP 06/28/2017   SpO2 100%   BMI 29.72 kg/m  Gen: well-appearing, NAD Dilation: 5.5 Effacement (%): 80, 90 Cervical Position: Anterior Station: -1 Presentation: Vertex Exam by:: Bonnie DoppAmber Knox, RN   Assessment and Plan: 37 y.o. G1P0 6522w1d here for IOL for cholestasis.   Labor: Induction started 12/8 morning with cytotec. Multiple doses of cytotec then transitioned to pitocin about 24 hours ago. AROM overnight at 0200, clear fluid. Minimal urine output, third spacing. May consider albumin if additional boluses needed -- pain control: epidural  -- PPH Risk: moderate given long induction   Fetal Well-Being: EFW 6-7lbs by Leopolds. Cephalic by sutures.  -- Category I - continuous fetal monitoring  -- GBS negative    Bonnie Cabacungan S. Earlene PlaterWallace, DO OB/GYN Fellow, Faculty Practice  11:10 AM

## 2018-03-18 ENCOUNTER — Encounter (HOSPITAL_COMMUNITY)
Admission: RE | Disposition: A | Discharge status: Home / Self Care | Payer: Self-pay | Source: Home / Self Care | Attending: Obstetrics and Gynecology

## 2018-03-18 ENCOUNTER — Encounter (HOSPITAL_COMMUNITY): Payer: Self-pay

## 2018-03-18 ENCOUNTER — Other Ambulatory Visit: Payer: Self-pay

## 2018-03-18 LAB — COMPREHENSIVE METABOLIC PANEL
ALBUMIN: 2.2 g/dL — AB (ref 3.5–5.0)
ALT: 23 U/L (ref 0–44)
AST: 36 U/L (ref 15–41)
Alkaline Phosphatase: 132 U/L — ABNORMAL HIGH (ref 38–126)
Anion gap: 11 (ref 5–15)
BUN: 15 mg/dL (ref 6–20)
CHLORIDE: 110 mmol/L (ref 98–111)
CO2: 19 mmol/L — ABNORMAL LOW (ref 22–32)
Calcium: 7.6 mg/dL — ABNORMAL LOW (ref 8.9–10.3)
Creatinine, Ser: 1.28 mg/dL — ABNORMAL HIGH (ref 0.44–1.00)
GFR calc Af Amer: >60 mL/min (ref >60)
GFR calc non Af Amer: 54 mL/min — ABNORMAL LOW (ref >60)
Glucose, Bld: 78 mg/dL (ref 70–99)
Potassium: 3.8 mmol/L (ref 3.5–5.1)
Sodium: 140 mmol/L (ref 135–145)
Total Bilirubin: 0.5 mg/dL (ref 0.3–1.2)
Total Protein: 5.1 g/dL — ABNORMAL LOW (ref 6.5–8.1)

## 2018-03-18 LAB — CBC
HCT: 26.2 % — ABNORMAL LOW (ref 36.0–46.0)
Hemoglobin: 8.4 g/dL — ABNORMAL LOW (ref 12.0–15.0)
MCH: 27.5 pg (ref 26.0–34.0)
MCHC: 32.1 g/dL (ref 30.0–36.0)
MCV: 85.6 fL (ref 80.0–100.0)
Platelets: 222 10*3/uL (ref 150–400)
RBC: 3.06 MIL/uL — ABNORMAL LOW (ref 3.87–5.11)
RDW: 15.2 % (ref 11.5–15.5)
WBC: 13.2 10*3/uL — ABNORMAL HIGH (ref 4.0–10.5)
nRBC: 0.3 % — ABNORMAL HIGH (ref 0.0–0.2)

## 2018-03-18 LAB — CREATININE, SERUM
Creatinine, Ser: 1.31 mg/dL — ABNORMAL HIGH (ref 0.44–1.00)
GFR, EST NON AFRICAN AMERICAN: 52 mL/min — AB (ref >60)

## 2018-03-18 SURGERY — Surgical Case
Anesthesia: Epidural

## 2018-03-18 MED ORDER — SODIUM CHLORIDE 0.9% FLUSH: 3.0000 mL | INTRAVENOUS | Status: DC | PRN

## 2018-03-18 MED ORDER — CHLOROPROCAINE HCL (PF) 3 % IJ SOLN
INTRAMUSCULAR | Status: AC
  Filled 2018-03-18: qty 20

## 2018-03-18 MED ORDER — DIPHENHYDRAMINE HCL 25 MG PO CAPS: 25.0000 mg | ORAL_CAPSULE | ORAL | Status: DC | PRN

## 2018-03-18 MED ORDER — CEFAZOLIN SODIUM-DEXTROSE 2-4 GM/100ML-% IV SOLN
INTRAVENOUS | Status: AC
  Filled 2018-03-18: qty 100

## 2018-03-18 MED ORDER — DIBUCAINE 1 % RE OINT: 1.0000 "application " | TOPICAL_OINTMENT | RECTAL | Status: DC | PRN

## 2018-03-18 MED ORDER — ONDANSETRON HCL 4 MG/2ML IJ SOLN: 4.0000 mg | Freq: Once | INTRAMUSCULAR | Status: DC | PRN

## 2018-03-18 MED ORDER — CHLOROPROCAINE HCL (PF) 3 % IJ SOLN
INTRAMUSCULAR | Status: DC | PRN
  Administered 2018-03-18: 20 mL

## 2018-03-18 MED ORDER — DIPHENHYDRAMINE HCL 25 MG PO CAPS: 25.0000 mg | ORAL_CAPSULE | Freq: Four times a day (QID) | ORAL | Status: DC | PRN

## 2018-03-18 MED ORDER — KETOROLAC TROMETHAMINE 30 MG/ML IJ SOLN
INTRAMUSCULAR | Status: AC
  Filled 2018-03-18: qty 1

## 2018-03-18 MED ORDER — MORPHINE SULFATE (PF) 0.5 MG/ML IJ SOLN
INTRAMUSCULAR | Status: DC | PRN
  Administered 2018-03-18: 3 mg via EPIDURAL
  Administered 2018-03-18: 2 mg via INTRAVENOUS

## 2018-03-18 MED ORDER — COCONUT OIL OIL: 1.0000 "application " | TOPICAL_OIL | Status: DC | PRN

## 2018-03-18 MED ORDER — ONDANSETRON HCL 4 MG/2ML IJ SOLN
INTRAMUSCULAR | Status: DC | PRN
  Administered 2018-03-18: 4 mg via INTRAVENOUS

## 2018-03-18 MED ORDER — FUROSEMIDE 10 MG/ML IJ SOLN
INTRAMUSCULAR | Status: AC
  Filled 2018-03-18: qty 4

## 2018-03-18 MED ORDER — MEPERIDINE HCL 25 MG/ML IJ SOLN: 6.2500 mg | INTRAMUSCULAR | Status: DC | PRN

## 2018-03-18 MED ORDER — PRENATAL MULTIVITAMIN CH
1.0000 | ORAL_TABLET | Freq: Every day | ORAL | Status: DC
  Administered 2018-03-19 – 2018-03-21 (×3): 1 via ORAL
  Filled 2018-03-18 (×3): qty 1

## 2018-03-18 MED ORDER — DEXAMETHASONE SODIUM PHOSPHATE 10 MG/ML IJ SOLN
INTRAMUSCULAR | Status: AC
  Filled 2018-03-18: qty 1

## 2018-03-18 MED ORDER — SCOPOLAMINE 1 MG/3DAYS TD PT72
1.0000 | MEDICATED_PATCH | Freq: Once | TRANSDERMAL | Status: DC
  Filled 2018-03-18: qty 1

## 2018-03-18 MED ORDER — ACETAMINOPHEN 160 MG/5ML PO SOLN: 325.0000 mg | ORAL | Status: DC | PRN

## 2018-03-18 MED ORDER — DIPHENHYDRAMINE HCL 50 MG/ML IJ SOLN: 12.5000 mg | INTRAMUSCULAR | Status: DC | PRN

## 2018-03-18 MED ORDER — DEXAMETHASONE SODIUM PHOSPHATE 10 MG/ML IJ SOLN
INTRAMUSCULAR | Status: DC | PRN
  Administered 2018-03-18: 10 mg via INTRAVENOUS

## 2018-03-18 MED ORDER — KETOROLAC TROMETHAMINE 30 MG/ML IJ SOLN
30.0000 mg | Freq: Four times a day (QID) | INTRAMUSCULAR | Status: AC | PRN
  Administered 2018-03-18 – 2018-03-19 (×2): 30 mg via INTRAMUSCULAR

## 2018-03-18 MED ORDER — SENNOSIDES-DOCUSATE SODIUM 8.6-50 MG PO TABS
2.0000 | ORAL_TABLET | ORAL | Status: DC
  Administered 2018-03-18 – 2018-03-20 (×3): 2 via ORAL
  Filled 2018-03-18 (×3): qty 2

## 2018-03-18 MED ORDER — NALBUPHINE HCL 10 MG/ML IJ SOLN
5.0000 mg | INTRAMUSCULAR | Status: DC | PRN
  Administered 2018-03-18: 5 mg via INTRAVENOUS
  Filled 2018-03-18: qty 1

## 2018-03-18 MED ORDER — MORPHINE SULFATE (PF) 0.5 MG/ML IJ SOLN
INTRAMUSCULAR | Status: AC
  Filled 2018-03-18: qty 10

## 2018-03-18 MED ORDER — TETANUS-DIPHTH-ACELL PERTUSSIS 5-2.5-18.5 LF-MCG/0.5 IM SUSP: 0.5000 mL | Freq: Once | INTRAMUSCULAR | Status: DC

## 2018-03-18 MED ORDER — NALBUPHINE HCL 10 MG/ML IJ SOLN: 5.0000 mg | INTRAMUSCULAR | Status: DC | PRN

## 2018-03-18 MED ORDER — SIMETHICONE 80 MG PO CHEW
80.0000 mg | CHEWABLE_TABLET | Freq: Three times a day (TID) | ORAL | Status: DC
  Administered 2018-03-18 – 2018-03-21 (×7): 80 mg via ORAL
  Filled 2018-03-18 (×8): qty 1

## 2018-03-18 MED ORDER — OXYTOCIN 10 UNIT/ML IJ SOLN
INTRAVENOUS | Status: DC | PRN
  Administered 2018-03-18: 40 [IU] via INTRAVENOUS

## 2018-03-18 MED ORDER — ACETAMINOPHEN 325 MG PO TABS: 325.0000 mg | ORAL_TABLET | ORAL | Status: DC | PRN

## 2018-03-18 MED ORDER — OXYTOCIN 40 UNITS IN LACTATED RINGERS INFUSION - SIMPLE MED: 2.5000 [IU]/h | INTRAVENOUS | Status: AC

## 2018-03-18 MED ORDER — FUROSEMIDE 10 MG/ML IJ SOLN
40.0000 mg | Freq: Once | INTRAMUSCULAR | Status: AC
  Administered 2018-03-18: 40 mg via INTRAVENOUS

## 2018-03-18 MED ORDER — SCOPOLAMINE 1 MG/3DAYS TD PT72
MEDICATED_PATCH | TRANSDERMAL | Status: AC
  Filled 2018-03-18: qty 1

## 2018-03-18 MED ORDER — OXYTOCIN 10 UNIT/ML IJ SOLN
INTRAMUSCULAR | Status: AC
  Filled 2018-03-18: qty 4

## 2018-03-18 MED ORDER — WITCH HAZEL-GLYCERIN EX PADS: 1.0000 "application " | MEDICATED_PAD | CUTANEOUS | Status: DC | PRN

## 2018-03-18 MED ORDER — NALBUPHINE HCL 10 MG/ML IJ SOLN: 5.0000 mg | Freq: Once | INTRAMUSCULAR | Status: DC | PRN

## 2018-03-18 MED ORDER — SIMETHICONE 80 MG PO CHEW
80.0000 mg | CHEWABLE_TABLET | ORAL | Status: DC
  Administered 2018-03-18 – 2018-03-20 (×3): 80 mg via ORAL
  Filled 2018-03-18 (×2): qty 1

## 2018-03-18 MED ORDER — POTASSIUM CHLORIDE CRYS ER 20 MEQ PO TBCR
20.0000 meq | EXTENDED_RELEASE_TABLET | Freq: Two times a day (BID) | ORAL | Status: AC
  Administered 2018-03-18: 20 meq via ORAL
  Filled 2018-03-18 (×2): qty 1

## 2018-03-18 MED ORDER — SODIUM CHLORIDE 0.9 % IR SOLN
Status: DC | PRN
  Administered 2018-03-18: 1

## 2018-03-18 MED ORDER — OXYCODONE HCL 5 MG/5ML PO SOLN: 5.0000 mg | Freq: Once | ORAL | Status: DC | PRN

## 2018-03-18 MED ORDER — LACTATED RINGERS IV SOLN
INTRAVENOUS | Status: DC | PRN
  Administered 2018-03-18: 06:00:00 via INTRAVENOUS

## 2018-03-18 MED ORDER — KETOROLAC TROMETHAMINE 30 MG/ML IJ SOLN
30.0000 mg | Freq: Four times a day (QID) | INTRAMUSCULAR | Status: AC | PRN
  Filled 2018-03-18: qty 1

## 2018-03-18 MED ORDER — SIMETHICONE 80 MG PO CHEW: 80.0000 mg | CHEWABLE_TABLET | ORAL | Status: DC | PRN

## 2018-03-18 MED ORDER — CEFAZOLIN SODIUM-DEXTROSE 2-4 GM/100ML-% IV SOLN
2.0000 g | Freq: Once | INTRAVENOUS | Status: AC
  Administered 2018-03-18: 2 g via INTRAVENOUS

## 2018-03-18 MED ORDER — FENTANYL CITRATE (PF) 100 MCG/2ML IJ SOLN
INTRAMUSCULAR | Status: AC
  Filled 2018-03-18: qty 2

## 2018-03-18 MED ORDER — MENTHOL 3 MG MT LOZG: 1.0000 | LOZENGE | OROMUCOSAL | Status: DC | PRN

## 2018-03-18 MED ORDER — GABAPENTIN 100 MG PO CAPS
100.0000 mg | ORAL_CAPSULE | Freq: Two times a day (BID) | ORAL | Status: DC
  Administered 2018-03-18 – 2018-03-21 (×6): 100 mg via ORAL
  Filled 2018-03-18 (×9): qty 1

## 2018-03-18 MED ORDER — ONDANSETRON HCL 4 MG/2ML IJ SOLN: 4.0000 mg | Freq: Three times a day (TID) | INTRAMUSCULAR | Status: DC | PRN

## 2018-03-18 MED ORDER — SCOPOLAMINE 1 MG/3DAYS TD PT72
MEDICATED_PATCH | TRANSDERMAL | Status: DC | PRN
  Administered 2018-03-18: 1 via TRANSDERMAL

## 2018-03-18 MED ORDER — FENTANYL CITRATE (PF) 100 MCG/2ML IJ SOLN
25.0000 ug | INTRAMUSCULAR | Status: DC | PRN
  Administered 2018-03-18 (×2): 25 ug via INTRAVENOUS

## 2018-03-18 MED ORDER — OXYCODONE HCL 5 MG PO TABS: 5.0000 mg | ORAL_TABLET | Freq: Once | ORAL | Status: DC | PRN

## 2018-03-18 MED ORDER — NALOXONE HCL 0.4 MG/ML IJ SOLN: 0.4000 mg | INTRAMUSCULAR | Status: DC | PRN

## 2018-03-18 MED ORDER — LACTATED RINGERS IV SOLN: INTRAVENOUS | Status: DC

## 2018-03-18 MED ORDER — IBUPROFEN 800 MG PO TABS
800.0000 mg | ORAL_TABLET | Freq: Three times a day (TID) | ORAL | Status: AC
  Administered 2018-03-18 – 2018-03-21 (×7): 800 mg via ORAL
  Filled 2018-03-18 (×8): qty 1

## 2018-03-18 MED ORDER — ONDANSETRON HCL 4 MG/2ML IJ SOLN
INTRAMUSCULAR | Status: AC
  Filled 2018-03-18: qty 2

## 2018-03-18 MED ORDER — NALOXONE HCL 4 MG/10ML IJ SOLN: 1.0000 ug/kg/h | INTRAVENOUS | Status: DC | PRN

## 2018-03-18 MED ORDER — ENOXAPARIN SODIUM 40 MG/0.4ML ~~LOC~~ SOLN
40.0000 mg | SUBCUTANEOUS | Status: DC
  Administered 2018-03-19 – 2018-03-21 (×3): 40 mg via SUBCUTANEOUS
  Filled 2018-03-18 (×3): qty 0.4

## 2018-03-18 SURGICAL SUPPLY — 29 items
BENZOIN TINCTURE PRP APPL 2/3 (GAUZE/BANDAGES/DRESSINGS) ×3 IMPLANT
CHLORAPREP W/TINT 26ML (MISCELLANEOUS) ×3 IMPLANT
CLAMP CORD UMBIL (MISCELLANEOUS) IMPLANT
CLOSURE STERI STRIP 1/2 X4 (GAUZE/BANDAGES/DRESSINGS) ×3 IMPLANT
DRSG OPSITE POSTOP 4X10 (GAUZE/BANDAGES/DRESSINGS) ×3 IMPLANT
ELECT REM PT RETURN 9FT ADLT (ELECTROSURGICAL) ×3
ELECTRODE REM PT RTRN 9FT ADLT (ELECTROSURGICAL) ×1 IMPLANT
EXTRACTOR VACUUM M CUP 4 TUBE (SUCTIONS) IMPLANT
EXTRACTOR VACUUM M CUP 4' TUBE (SUCTIONS)
GLOVE BIOGEL PI IND STRL 6.5 (GLOVE) ×1 IMPLANT
GLOVE BIOGEL PI IND STRL 7.0 (GLOVE) ×1 IMPLANT
GLOVE BIOGEL PI INDICATOR 6.5 (GLOVE) ×2
GLOVE BIOGEL PI INDICATOR 7.0 (GLOVE) ×2
GLOVE SURG SS PI 6.0 STRL IVOR (GLOVE) ×3 IMPLANT
GOWN STRL REUS W/TWL LRG LVL3 (GOWN DISPOSABLE) ×6 IMPLANT
KIT ABG SYR 3ML LUER SLIP (SYRINGE) IMPLANT
NEEDLE HYPO 25X5/8 SAFETYGLIDE (NEEDLE) IMPLANT
NS IRRIG 1000ML POUR BTL (IV SOLUTION) ×3 IMPLANT
PACK C SECTION WH (CUSTOM PROCEDURE TRAY) ×3 IMPLANT
PAD OB MATERNITY 4.3X12.25 (PERSONAL CARE ITEMS) ×3 IMPLANT
PENCIL SMOKE EVAC W/HOLSTER (ELECTROSURGICAL) ×3 IMPLANT
RTRCTR C-SECT PINK 25CM LRG (MISCELLANEOUS) IMPLANT
SEPRAFILM MEMBRANE 5X6 (MISCELLANEOUS) IMPLANT
SPONGE LAP 18X18 RF (DISPOSABLE) ×9 IMPLANT
SUT PLAIN 0 NONE (SUTURE) IMPLANT
SUT VIC AB 0 CT1 36 (SUTURE) ×15 IMPLANT
SUT VIC AB 4-0 KS 27 (SUTURE) ×3 IMPLANT
TOWEL OR 17X24 6PK STRL BLUE (TOWEL DISPOSABLE) ×3 IMPLANT
TRAY FOLEY W/BAG SLVR 14FR LF (SET/KITS/TRAYS/PACK) ×3 IMPLANT

## 2018-03-18 NOTE — Anesthesia Postprocedure Evaluation (Signed)
Anesthesia Post Note  Patient: Clyde LundborgClaudia Gaytan Ibarra  Procedure(s) Performed: CESAREAN SECTION (N/A )     Patient location during evaluation: Mother Baby Anesthesia Type: Epidural Level of consciousness: awake and alert Pain management: pain level controlled Vital Signs Assessment: post-procedure vital signs reviewed and stable Respiratory status: spontaneous breathing, nonlabored ventilation and respiratory function stable Cardiovascular status: stable Postop Assessment: no headache, no backache and epidural receding Anesthetic complications: no    Last Vitals:  Vitals:   03/18/18 0919 03/18/18 1116  BP: 118/68 110/66  Pulse: 90 80  Resp: 16 18  Temp: 36.7 C 36.8 C  SpO2: 94%     Last Pain:  Vitals:   03/18/18 1116  TempSrc: Oral  PainSc:    Pain Goal:                 Junious SilkGILBERT,Orvis Stann

## 2018-03-18 NOTE — Plan of Care (Signed)
RN in consistent communication with CNM and resident concerning patient condition. Concerns expressed at beginning of shift related to change in assessment from last night (presence of clonus, increase in edema, as well as persistence of headache and low urine output).  Oxygen saturations in low 80s upon pulse ox assessment around 2100, oxygen applied and CNM and resident made aware.  PT has been on constant pulse ox and 10 L O2 by nonrebreather mask since initial application of O2.  Pushed for a total of 2 hours (1.5 hours of pushing, one hour of laboring down, and 30 more minutes of pushing). No change in fetal station upon RN assessment.  Pt now stating that she "feels like the baby is higher in her belly, it is hard to breathe" (per [employer] translator).  CNM has spoken with attending MD regarding pt situation.  Will continue to monitor and inform team of any changes.  Pt currently resting comfortably in bed, O2 sats at 97 on 10 L O2.   Problem: Education: Goal: Knowledge of Childbirth will improve Outcome: Progressing Goal: Ability to make informed decisions regarding treatment and plan of care will improve Outcome: Progressing Goal: Ability to state and carry out methods to decrease the pain will improve Outcome: Progressing   Problem: Coping: Goal: Ability to verbalize concerns and feelings about labor and delivery will improve Outcome: Progressing   Problem: Life Cycle: Goal: Ability to make normal progression through stages of labor will improve Outcome: Progressing   Problem: Role Relationship: Goal: Ability to demonstrate positive interaction with the child will improve Outcome: Progressing   Problem: Pain Management: Goal: Relief or control of pain from uterine contractions will improve Outcome: Progressing

## 2018-03-18 NOTE — Op Note (Signed)
Bonnie Carey PROCEDURE DATE: 03/14/2018 - 03/18/2018  PREOPERATIVE DIAGNOSIS: Intrauterine pregnancy at  7048w4d weeks gestation; failure to progress: arrest of descent  POSTOPERATIVE DIAGNOSIS: The same  PROCEDURE:     Cesarean Section  SURGEON:  Dr. Catalina AntiguaPeggy Yamileth Hayse  ASSISTANT: none  INDICATIONS: Bonnie LundborgClaudia Gaytan Carey is a 37 y.o. G1P0 at 4748w4d scheduled for cesarean section secondary to failure to progress: arrest of descent.  The risks of cesarean section discussed with the patient included but were not limited to: bleeding which may require transfusion or reoperation; infection which may require antibiotics; injury to bowel, bladder, ureters or other surrounding organs; injury to the fetus; need for additional procedures including hysterectomy in the event of a life-threatening hemorrhage; placental abnormalities wth subsequent pregnancies, incisional problems, thromboembolic phenomenon and other postoperative/anesthesia complications. The patient concurred with the proposed plan, giving informed written consent for the procedure.    FINDINGS:  Viable female infant in cephalic presentation.  Apgars not available at time of note.  Clear amniotic fluid.  Intact placenta, three vessel cord.  Normal uterus, fallopian tubes and ovaries bilaterally.  ANESTHESIA:    Spinal INTRAVENOUS FLUIDS:1600 ml ESTIMATED BLOOD LOSS: 637 mL ml URINE OUTPUT:  200 ml SPECIMENS: Placenta sent to L&D COMPLICATIONS: None immediate  PROCEDURE IN DETAIL:  The patient received intravenous antibiotics and had sequential compression devices applied to her lower extremities while in the preoperative area.  She was then taken to the operating room where anesthesia was induced and was found to be adequate. A foley catheter was placed into her bladder and attached to Mylin Gignac gravity. She was then placed in a dorsal supine position with a leftward tilt, and prepped and draped in a sterile manner. After an adequate  timeout was performed, a Pfannenstiel skin incision was made with scalpel and carried through to the underlying layer of fascia. The fascia was incised in the midline and this incision was extended bilaterally using the Mayo scissors. Kocher clamps were applied to the superior aspect of the fascial incision and the underlying rectus muscles were dissected off bluntly. A similar process was carried out on the inferior aspect of the facial incision. The rectus muscles were separated in the midline bluntly and the peritoneum was entered bluntly. The Alexis self-retaining retractor was introduced into the abdominal cavity. Attention was turned to the lower uterine segment where a bladder flap was created, and a transverse hysterotomy was made with a scalpel and extended bilaterally bluntly. The infant was successfully delivered, and cord was clamped and cut and infant was handed over to awaiting neonatology team. Uterine massage was then administered and the placenta delivered intact with three-vessel cord. The uterus was cleared of clot and debris.  The hysterotomy was closed with 0 Vicryl in a running locked fashion, and an imbricating layer was also placed with a 0 Vicryl. Overall, excellent hemostasis was noted. The pelvis copiously irrigated and cleared of all clot and debris. Hemostasis was confirmed on all surfaces.  The peritoneum and the muscles were reapproximated using 0 vicryl interrupted stitches. The fascia was then closed using 0 Vicryl in a running fashion.  The skin was closed in a subcuticular fashion using 3.0 Vicryl. The patient tolerated the procedure well. Sponge, lap, instrument and needle counts were correct x 2. She was taken to the recovery room in stable condition.    Bonnie Carey  03/18/2018 6:32 AM

## 2018-03-18 NOTE — Discharge Summary (Signed)
Postpartum Discharge Summary     Patient Name: Bonnie Carey DOB: [birth year] MRN: [id number]  Date of admission: 03/14/2018 Delivering Provider: CONSTANT, PEGGY   Date of discharge: 03/21/2018  Admitting diagnosis: 36 WKS INDUCTION Intrauterine pregnancy: [redacted]w[redacted]d     Secondary diagnosis:  Principal Problem:   S/P cesarean section Active Problems:   Advanced maternal age, primigravida in first trimester, antepartum   Cholestasis during pregnancy in third trimester   Postoperative anemia   Pulmonary edema   Elevated serum creatinine  Additional problems:  Intrapartum AKI  Intrapartum Pulmonary Edema  Chorio     Discharge diagnosis: Term Pregnancy Delivered and Cholestasis                                                                                                Post partum procedures:None  Augmentation: AROM, Pitocin, Cytotec and Foley Balloon  Complications:  Intrapartum AKI  Intrapartum Pulmonary Edema   Hospital course:  Induction of Labor With Cesarean Section  37 y.o. yo G1P0 at [redacted]w[redacted]d was admitted to the hospital 03/14/2018 for induction of labor. Patient had a labor course significant for substantial third spacing of fluid with intravascular depletion during her induction, resulting in AKI and pulmonary edema requiring supplemental oxygen during pushing. The patient developed elevated temperatures requiring antibiotics on the second day of her induction. The patient went for cesarean section due to Arrest of Descent, and delivered a Viable infant,03/18/2018  Membrane Rupture Time/Date: 2:02 AM ,03/17/2018   Details of operation can be found in separate operative Note.  Pts PP course was complicated with pulm edema and rising Cr. This was managed with lasix and was improved upon discharge.  She is ambulating, tolerating a regular diet, passing flatus, and urinating well.  Patient is discharged home in stable condition on 03/21/2018.                                Magnesium Sulfate recieved: No BMZ received: No  Physical exam  Vitals:   03/21/18 0339 03/21/18 0741 03/21/18 0932 03/21/18 1203  BP: 120/64 114/75  126/70  Pulse: (!) 58 68  65  Resp: 18 18  18   Temp: 97.8 F (36.6 C) 97.8 F (36.6 C)  97.9 F (36.6 C)  TempSrc: Oral Oral  Oral  SpO2:  91% 96% 95%  Weight:      Height:       General: alert, cooperative and no distress Lochia: appropriate Uterine Fundus: firm Incision: Healing well with no significant drainage, No significant erythema, Dressing is clean, dry, and intact DVT Evaluation: No evidence of DVT seen on physical exam. Negative Homan's sign. No cords or calf tenderness. No significant calf/ankle edema. Labs: Lab Results  Component Value Date   WBC 8.8 03/21/2018   HGB 11.0 (L) 03/21/2018   HCT 33.4 (L) 03/21/2018   MCV 86.3 03/21/2018   PLT 219 03/21/2018   CMP Latest Ref Rng & Units 03/21/2018  Glucose 70 - 99 mg/dL 77  BUN 6 - 20 mg/dL 40(J)  Creatinine 8.11 -  1.00 mg/dL 6.210.84  Sodium 308135 - 657145 mmol/L 139  Potassium 3.5 - 5.1 mmol/L 3.3(L)  Chloride 98 - 111 mmol/L 109  CO2 22 - 32 mmol/L 21(L)  Calcium 8.9 - 10.3 mg/dL 7.6(L)  Total Protein 6.5 - 8.1 g/dL 4.8(L)  Total Bilirubin 0.3 - 1.2 mg/dL 0.7  Alkaline Phos 38 - 126 U/L 162(H)  AST 15 - 41 U/L 45(H)  ALT 0 - 44 U/L 40    Discharge instruction: per After Visit Summary and "Baby and Me Booklet".  Allergies as of 03/21/2018   No Known Allergies     Medication List    STOP taking these medications   lidocaine 5 % ointment Commonly known as:  XYLOCAINE   ursodiol 300 MG capsule Commonly known as:  ACTIGALL     TAKE these medications   diphenhydrAMINE 2 % cream Commonly known as:  BENADRYL Apply topically 3 (three) times daily as needed for itching.   gabapentin 100 MG capsule Commonly known as:  NEURONTIN Take 1 capsule (100 mg total) by mouth 3 (three) times daily as needed.   hydrOXYzine 25 MG tablet Commonly known as:   ATARAX/VISTARIL Take 1 tablet (25 mg total) by mouth every 4 (four) hours as needed for itching.   ibuprofen 800 MG tablet Commonly known as:  ADVIL,MOTRIN Take 1 tablet (800 mg total) by mouth every 8 (eight) hours.   Oxycodone HCl 10 MG Tabs Take 1 tablet (10 mg total) by mouth every 6 (six) hours as needed for severe pain.   PRENATAL VITAMINS PO Take 1 tablet by mouth daily.            Discharge Care Instructions  (From admission, onward)         Start     Ordered   03/21/18 0000  Discharge wound care:    Comments:  Remove outer dressing in 4 days.   03/21/18 1424          Diet: routine diet  Activity: Advance as tolerated. Pelvic rest for 6 weeks.   Outpatient follow up:2 weeks Follow up Appt:No future appointments. Follow up Visit:    Please schedule this patient for Postpartum visit in: 4 weeks with the following provider: Any provider For C/S patients schedule nurse incision check in weeks 2 weeks: yes High risk pregnancy complicated by: cholestasis Delivery mode:  CS Anticipated Birth Control: Depo Provera PP Procedures needed: Incision check  Schedule Integrated BH visit: no  Newborn Data: Live born female  Birth Weight: 7 lb 15.7 oz (3620 g) APGAR: 9, 9  Newborn Delivery   Birth date/time:  03/18/2018 05:44:00 Delivery type:  C-Section, Low Transverse Trial of labor:  Yes C-section categorization:  Primary     Baby Feeding: Breast Disposition:home with mother   03/21/2018 Willodean Rosenthalarolyn Harraway-Smith, MD

## 2018-03-18 NOTE — Transfer of Care (Signed)
Immediate Anesthesia Transfer of Care Note  Patient: Bonnie LundborgClaudia Gaytan Carey  Procedure(s) Performed: CESAREAN SECTION (N/A )  Patient Location: PACU  Anesthesia Type:Epidural  Level of Consciousness: awake, alert  and oriented  Airway & Oxygen Therapy: Patient Spontanous Breathing and Patient connected to nasal cannula oxygen  Post-op Assessment: Report given to RN and Post -op Vital signs reviewed and stable  Post vital signs: Reviewed and stable  Last Vitals:  Vitals Value Taken Time  BP 126/65 03/18/2018  6:47 AM  Temp    Pulse 102 03/18/2018  6:49 AM  Resp 24 03/18/2018  6:49 AM  SpO2 91 % 03/18/2018  6:49 AM  Vitals shown include unvalidated device data.  Last Pain:  Vitals:   03/18/18 0501  TempSrc: Oral  PainSc:          Complications: No apparent anesthesia complications

## 2018-03-18 NOTE — Lactation Note (Signed)
This note was copied from a baby's chart. Lactation Consultation Note: I used video interpreter Ricki Rodriguezdriana [phone] for my visit. Mom very sleepy. Reports she has not latched baby because she has hives and has not had shower since Sunday and she wants to shower before she breastfeeds baby, Pecola LeisureBaby has had formula and is asleep in bassinet. Reviewed importance of frequent breast feeding to promote good milk supply. Offered breast pump and mom agreeable. Brought to room but mom is so sleepy will ask RN to set it up when mom more awake. Spanish BF brochure given. Encouraged mom to call for assist prn when ready to put baby to the breast. No questions at present.   Patient Name: Bonnie Carey EAVWU'JToday's Date: 03/18/2018 Reason for consult: Initial assessment;Early term 37-38.6wks   Maternal Data Formula Feeding for Exclusion: Yes Reason for exclusion: Mother's choice to formula and breast feed on admission Does the patient have breastfeeding experience prior to this delivery?: No  Feeding    LATCH Score                   Interventions    Lactation Tools Discussed/Used     Consult Status Consult Status: Follow-up Date: 03/19/18 Follow-up type: In-patient    Pamelia HoitWeeks, Mandee Pluta D 03/18/2018, 10:48 AM

## 2018-03-18 NOTE — Progress Notes (Signed)
Patient ID: Bonnie LundborgClaudia Gaytan Carey, female   DOB: Mar 16, 1981, 37 y.o.   MRN: [id number] Patient being induced for cholestasis of pregnancy at 151w4d. Patient has been complete and pusing for over 2.5 hours with no change in fetal station( zero station) despite good maternal effort. Patient exhausted and now requesting cesarean section. Fetal status remains reassuring Risks, benefits and alternatives were reviewed including but not limited to risks of bleeding, infection and damage to adjacent organs. Patient verbalized understanding and all questions were answered. Spanish interpreter was used during the counseling

## 2018-03-18 NOTE — Addendum Note (Signed)
Addendum  created 03/18/18 1453 by Junious SilkGilbert, Celine Dishman, CRNA   Clinical Note Signed

## 2018-03-18 NOTE — Anesthesia Postprocedure Evaluation (Signed)
Anesthesia Post Note  Patient: Bonnie Carey  Procedure(s) Performed: CESAREAN SECTION (N/A )     Patient location during evaluation: PACU Anesthesia Type: Epidural Level of consciousness: oriented and awake and alert Pain management: pain level controlled Vital Signs Assessment: post-procedure vital signs reviewed and stable Respiratory status: spontaneous breathing, respiratory function stable, patient connected to nasal cannula oxygen and nonlabored ventilation Cardiovascular status: blood pressure returned to baseline and stable Postop Assessment: no headache, no backache, no apparent nausea or vomiting, epidural receding and patient able to bend at knees Anesthetic complications: no    Last Vitals:  Vitals:   03/18/18 0715 03/18/18 0730  BP: 134/79 (!) 114/100  Pulse: 96 97  Resp: 20 19  Temp:    SpO2: 93% 90%    Last Pain:  Vitals:   03/18/18 0735  TempSrc:   PainSc: 2    Pain Goal:                 [name] A.

## 2018-03-19 ENCOUNTER — Encounter (HOSPITAL_COMMUNITY): Payer: Self-pay

## 2018-03-19 DIAGNOSIS — R7989 Other specified abnormal findings of blood chemistry: Secondary | ICD-10-CM | POA: Diagnosis not present

## 2018-03-19 DIAGNOSIS — J811 Chronic pulmonary edema: Secondary | ICD-10-CM | POA: Diagnosis not present

## 2018-03-19 DIAGNOSIS — D649 Anemia, unspecified: Secondary | ICD-10-CM | POA: Diagnosis not present

## 2018-03-19 DIAGNOSIS — Z98891 History of uterine scar from previous surgery: Secondary | ICD-10-CM

## 2018-03-19 LAB — COMPREHENSIVE METABOLIC PANEL
ALT: 24 U/L (ref 0–44)
AST: 31 U/L (ref 15–41)
Albumin: 1.7 g/dL — ABNORMAL LOW (ref 3.5–5.0)
Alkaline Phosphatase: 96 U/L (ref 38–126)
Anion gap: 6 (ref 5–15)
BUN: 24 mg/dL — ABNORMAL HIGH (ref 6–20)
CO2: 21 mmol/L — ABNORMAL LOW (ref 22–32)
Calcium: 7.3 mg/dL — ABNORMAL LOW (ref 8.9–10.3)
Chloride: 109 mmol/L (ref 98–111)
Creatinine, Ser: 1.24 mg/dL — ABNORMAL HIGH (ref 0.44–1.00)
GFR calc Af Amer: >60 mL/min (ref >60)
GFR calc non Af Amer: 56 mL/min — ABNORMAL LOW (ref >60)
Glucose, Bld: 78 mg/dL (ref 70–99)
POTASSIUM: 3.6 mmol/L (ref 3.5–5.1)
Sodium: 136 mmol/L (ref 135–145)
Total Bilirubin: 0.6 mg/dL (ref 0.3–1.2)
Total Protein: 4.2 g/dL — ABNORMAL LOW (ref 6.5–8.1)

## 2018-03-19 LAB — CBC
HCT: 18.9 % — ABNORMAL LOW (ref 36.0–46.0)
Hemoglobin: 6.3 g/dL — CL (ref 12.0–15.0)
MCH: 28.1 pg (ref 26.0–34.0)
MCHC: 33.3 g/dL (ref 30.0–36.0)
MCV: 84.4 fL (ref 80.0–100.0)
Platelets: 196 10*3/uL (ref 150–400)
RBC: 2.24 MIL/uL — ABNORMAL LOW (ref 3.87–5.11)
RDW: 15.3 % (ref 11.5–15.5)
WBC: 11.3 10*3/uL — ABNORMAL HIGH (ref 4.0–10.5)
nRBC: 0.9 % — ABNORMAL HIGH (ref 0.0–0.2)

## 2018-03-19 LAB — PREPARE RBC (CROSSMATCH)

## 2018-03-19 MED ORDER — FUROSEMIDE 10 MG/ML IJ SOLN
20.0000 mg | Freq: Once | INTRAMUSCULAR | Status: AC
  Administered 2018-03-19: 20 mg via INTRAVENOUS
  Filled 2018-03-19: qty 2

## 2018-03-19 MED ORDER — FUROSEMIDE 40 MG PO TABS
40.0000 mg | ORAL_TABLET | Freq: Every day | ORAL | Status: DC
  Administered 2018-03-20 – 2018-03-21 (×2): 40 mg via ORAL
  Filled 2018-03-19 (×3): qty 1

## 2018-03-19 MED ORDER — SODIUM CHLORIDE 0.9% IV SOLUTION
Freq: Once | INTRAVENOUS | Status: AC
  Administered 2018-03-19: 14:00:00 via INTRAVENOUS

## 2018-03-19 MED ORDER — DIPHENHYDRAMINE HCL 25 MG PO CAPS
25.0000 mg | ORAL_CAPSULE | Freq: Once | ORAL | Status: AC
  Administered 2018-03-19: 25 mg via ORAL
  Filled 2018-03-19: qty 1

## 2018-03-19 MED ORDER — ACETAMINOPHEN 500 MG PO TABS
1000.0000 mg | ORAL_TABLET | Freq: Once | ORAL | Status: AC
  Administered 2018-03-19: 1000 mg via ORAL
  Filled 2018-03-19: qty 2

## 2018-03-19 NOTE — Progress Notes (Signed)
Postpartum Day 1: Cesarean Delivery for arrest of descent after failed IOL for cholestasis; labor course remarkable for pulmonary edema, increased Cr.  Subjective: Patient is Spanish-speaking only, Spanish interpreter present for this encounter. Patient reports incisional pain and tolerating PO.  No flatus yet. Baby is stable at bedside.   Objective: Vital signs in last 24 hours: Temp:  [97.5 F (36.4 C)-98.6 F (37 C)] 98 F (36.7 C) (12/13 1030) Pulse Rate:  [80-107] 89 (12/13 1041) Resp:  [16-20] 18 (12/13 1030) BP: (105-134)/(61-70) 107/66 (12/13 1041) SpO2:  [70 %-98 %] 97 % (12/13 1041)   Intake/Output Summary (Last 24 hours) at 03/19/2018 1211 Last data filed at 03/19/2018 0948 Gross per 24 hour  Intake 1800 ml  Output 945 ml  Net 855 ml    Physical Exam:  General: alert and no distress Lochia: appropriate Uterine Fundus: firm Incision: no significant drainage, no significant erythema DVT Evaluation: No evidence of DVT seen on physical exam. Negative Homan's sign. 2-3+ BLE  Labs: CMP Latest Ref Rng & Units 03/19/2018 03/18/2018 03/18/2018  Glucose 70 - 99 mg/dL 78 - 78  BUN 6 - 20 mg/dL 47(Q24(H) - 15  Creatinine 0.44 - 1.00 mg/dL 2.59(D1.24(H) 6.38(V1.31(H) 5.64(P1.28(H)  Sodium 135 - 145 mmol/L 136 - 140  Potassium 3.5 - 5.1 mmol/L 3.6 - 3.8  Chloride 98 - 111 mmol/L 109 - 110  CO2 22 - 32 mmol/L 21(L) - 19(L)  Calcium 8.9 - 10.3 mg/dL 7.3(L) - 7.6(L)  Total Protein 6.5 - 8.1 g/dL 4.2(L) - 5.1(L)  Total Bilirubin 0.3 - 1.2 mg/dL 0.6 - 0.5  Alkaline Phos 38 - 126 U/L 96 - 132(H)  AST 15 - 41 U/L 31 - 36  ALT 0 - 44 U/L 24 - 23   CBC Latest Ref Rng & Units 03/19/2018 03/18/2018 03/17/2018  WBC 4.0 - 10.5 K/uL 11.3(H) 13.2(H) 10.4  Hemoglobin 12.0 - 15.0 g/dL 6.3(LL) 8.4(L) 9.2(L)  Hematocrit 36.0 - 46.0 % 18.9(L) 26.2(L) 29.2(L)  Platelets 150 - 400 K/uL 196 222 228   Assessment/Plan: Status post Cesarean section. Postoperative course complicated by anemia.  Principal  Problem:   S/P cesarean section Active Problems:   Advanced maternal age, primigravida in first trimester, antepartum   Cholestasis during pregnancy in third trimester   Postoperative anemia   Pulmonary edema   Elevated serum creatinine  *Ordered for 3 units of PRBCs for anemia, with Lasix before and after 2nd unit. Will continue to monitor closely *Adequate UOP, Cr getting better for now. Continue to monitor *No symptoms of pulmonary edema, still has impressive peripheral edema, continue Lasix therapy. *Normotensive, no signs or preeclampsia *Breast and bottle feeding, desires patch for contraception *Continue close inpatient monitoring and postpartum care.    Jaynie CollinsUgonna Monalisa Bayless, MD 03/19/2018, 12:10 PM

## 2018-03-19 NOTE — Progress Notes (Signed)
Patient O2 saturation range 92-98 on 5L  O2.  On room air O2 saturation range from 70-88, no SOB noted, decreased bilateral breath sounds, Incentive spirometer up to 1000 q2h during night.  When O2 is decreased to 4L O2 sat. Low 90's

## 2018-03-19 NOTE — Progress Notes (Signed)
CRITICAL VALUE ALERT   Critical Value: hgb 6.3  Date & Time Notied: 03/19/18 0710  Provider Notified: Dr Vergie LivingPickens  Orders Received/Actions taken:no new orders  Results for Bonnie LundborgGAYTAN IBARRA, Caitlen (MRN [id number]) as of 03/19/2018 07:14  Ref. Range 03/19/2018 05:41  WBC Latest Ref Range: 4.0 - 10.5 K/uL 11.3 (H)  RBC Latest Ref Range: 3.87 - 5.11 MIL/uL 2.24 (L)  Hemoglobin Latest Ref Range: 12.0 - 15.0 g/dL 6.3 (LL)  HCT Latest Ref Range: 36.0 - 46.0 % 18.9 (L)  MCV Latest Ref Range: 80.0 - 100.0 fL 84.4  MCH Latest Ref Range: 26.0 - 34.0 pg 28.1  MCHC Latest Ref Range: 30.0 - 36.0 g/dL 40.933.3  RDW Latest Ref Range: 11.5 - 15.5 % 15.3  Platelets Latest Ref Range: 150 - 400 K/uL 196  nRBC Latest Ref Range: 0.0 - 0.2 % 0.9 (H)

## 2018-03-20 DIAGNOSIS — O9962 Diseases of the digestive system complicating childbirth: Secondary | ICD-10-CM

## 2018-03-20 DIAGNOSIS — Z3A37 37 weeks gestation of pregnancy: Secondary | ICD-10-CM

## 2018-03-20 LAB — COMPREHENSIVE METABOLIC PANEL
ALT: 29 U/L (ref 0–44)
AST: 35 U/L (ref 15–41)
Albumin: 1.8 g/dL — ABNORMAL LOW (ref 3.5–5.0)
Alkaline Phosphatase: 119 U/L (ref 38–126)
Anion gap: 7 (ref 5–15)
BUN: 27 mg/dL — ABNORMAL HIGH (ref 6–20)
CO2: 21 mmol/L — ABNORMAL LOW (ref 22–32)
Calcium: 7.8 mg/dL — ABNORMAL LOW (ref 8.9–10.3)
Chloride: 109 mmol/L (ref 98–111)
Creatinine, Ser: 1.03 mg/dL — ABNORMAL HIGH (ref 0.44–1.00)
GFR calc Af Amer: >60 mL/min (ref >60)
GFR calc non Af Amer: >60 mL/min (ref >60)
Glucose, Bld: 78 mg/dL (ref 70–99)
Potassium: 3.7 mmol/L (ref 3.5–5.1)
Sodium: 137 mmol/L (ref 135–145)
Total Bilirubin: 0.7 mg/dL (ref 0.3–1.2)
Total Protein: 4.7 g/dL — ABNORMAL LOW (ref 6.5–8.1)

## 2018-03-20 LAB — BPAM RBC
Blood Product Expiration Date: 201912242359
Blood Product Expiration Date: 201912252359
Blood Product Expiration Date: 201912282359
ISSUE DATE / TIME: 201912131356
ISSUE DATE / TIME: 201912131748
ISSUE DATE / TIME: 201912131005
Unit Type and Rh: 5100
Unit Type and Rh: 5100
Unit Type and Rh: 9500

## 2018-03-20 LAB — CBC
HCT: 33.6 % — ABNORMAL LOW (ref 36.0–46.0)
Hemoglobin: 11.3 g/dL — ABNORMAL LOW (ref 12.0–15.0)
MCH: 28.8 pg (ref 26.0–34.0)
MCHC: 33.6 g/dL (ref 30.0–36.0)
MCV: 85.7 fL (ref 80.0–100.0)
Platelets: 196 10*3/uL (ref 150–400)
RBC: 3.92 MIL/uL (ref 3.87–5.11)
RDW: 14.9 % (ref 11.5–15.5)
WBC: 10.1 10*3/uL (ref 4.0–10.5)
nRBC: 1.2 % — ABNORMAL HIGH (ref 0.0–0.2)

## 2018-03-20 LAB — TYPE AND SCREEN
ABO/RH(D): O POS
Antibody Screen: NEGATIVE
Unit division: 0
Unit division: 0
Unit division: 0

## 2018-03-20 LAB — MAGNESIUM: Magnesium: 1.4 mg/dL — ABNORMAL LOW (ref 1.7–2.4)

## 2018-03-20 MED ORDER — OXYCODONE HCL 5 MG PO TABS
10.0000 mg | ORAL_TABLET | ORAL | Status: DC | PRN
  Administered 2018-03-20: 10 mg via ORAL
  Filled 2018-03-20: qty 2

## 2018-03-20 MED ORDER — ACETAMINOPHEN 325 MG PO TABS
650.0000 mg | ORAL_TABLET | Freq: Four times a day (QID) | ORAL | Status: DC
  Administered 2018-03-20 – 2018-03-21 (×6): 650 mg via ORAL
  Filled 2018-03-20 (×6): qty 2

## 2018-03-20 MED ORDER — TRIAMCINOLONE ACETONIDE 0.1 % EX OINT
TOPICAL_OINTMENT | Freq: Two times a day (BID) | CUTANEOUS | Status: DC
  Administered 2018-03-20 – 2018-03-21 (×2): via TOPICAL
  Filled 2018-03-20: qty 15

## 2018-03-20 MED ORDER — HYDROXYZINE HCL 25 MG PO TABS
25.0000 mg | ORAL_TABLET | Freq: Three times a day (TID) | ORAL | Status: DC | PRN
  Administered 2018-03-20: 25 mg via ORAL
  Filled 2018-03-20 (×2): qty 1

## 2018-03-20 NOTE — Progress Notes (Signed)
Subjective:chest discomfort Postpartum Day 2: Cesarean Delivery Patient reports incisional pain, tolerating PO and no problems voiding.    Objective: Vital signs in last 24 hours: Temp:  [97.5 F (36.4 C)-98.8 F (37.1 C)] 98.6 F (37 C) (12/14 0451) Pulse Rate:  [61-95] 73 (12/14 0451) Resp:  [16-18] 17 (12/14 0451) BP: (105-135)/(61-71) 135/71 (12/14 0451) SpO2:  [92 %-99 %] 97 % (12/14 0451)  Physical Exam:  General: alert, cooperative and no distress Lochia: appropriate Uterine Fundus: firm Incision: healing well, no significant drainage DVT Evaluation: No evidence of DVT seen on physical exam. No significant calf/ankle edema.  Recent Labs    03/19/18 0541 03/20/18 0555  HGB 6.3* 11.3*  HCT 18.9* 33.6*   CMP Latest Ref Rng & Units 03/20/2018 03/19/2018 03/18/2018  Glucose 70 - 99 mg/dL 78 78 -  BUN 6 - 20 mg/dL 40(J27(H) 81(X24(H) -  Creatinine 0.44 - 1.00 mg/dL 9.14(N1.03(H) 8.29(F1.24(H) 6.21(H1.31(H)  Sodium 135 - 145 mmol/L 137 136 -  Potassium 3.5 - 5.1 mmol/L 3.7 3.6 -  Chloride 98 - 111 mmol/L 109 109 -  CO2 22 - 32 mmol/L 21(L) 21(L) -  Calcium 8.9 - 10.3 mg/dL 7.8(L) 7.3(L) -  Total Protein 6.5 - 8.1 g/dL 4.7(L) 4.2(L) -  Total Bilirubin 0.3 - 1.2 mg/dL 0.7 0.6 -  Alkaline Phos 38 - 126 U/L 119 96 -  AST 15 - 41 U/L 35 31 -  ALT 0 - 44 U/L 29 24 -   CBC    Component Value Date/Time   WBC 10.1 03/20/2018 0555   RBC 3.92 03/20/2018 0555   HGB 11.3 (L) 03/20/2018 0555   HCT 33.6 (L) 03/20/2018 0555   PLT 196 03/20/2018 0555   MCV 85.7 03/20/2018 0555   MCH 28.8 03/20/2018 0555   MCHC 33.6 03/20/2018 0555   RDW 14.9 03/20/2018 0555   LYMPHSABS 1.7 09/13/2017 1155   MONOABS 0.6 09/13/2017 1155   EOSABS 0.0 09/13/2017 1155   BASOSABS 0.0 09/13/2017 1155    Assessment/Plan: Status post Cesarean section. Postoperative course complicated by anemia  Continue current care. Principal Problem:   S/P cesarean section Active Problems:   Advanced maternal age, primigravida in  first trimester, antepartum   Cholestasis during pregnancy in third trimester   Postoperative anemia, s/p 3 units PRBC   Pulmonary edema   Elevated serum creatinine  Bonnie DarterJames  03/20/2018, 7:32 AM

## 2018-03-21 ENCOUNTER — Encounter (HOSPITAL_COMMUNITY): Payer: Self-pay

## 2018-03-21 LAB — CBC WITH DIFFERENTIAL/PLATELET
Basophils Absolute: 0 10*3/uL (ref 0.0–0.1)
Basophils Relative: 0 %
Eosinophils Absolute: 0.7 10*3/uL — ABNORMAL HIGH (ref 0.0–0.5)
Eosinophils Relative: 8 %
HCT: 33.4 % — ABNORMAL LOW (ref 36.0–46.0)
Hemoglobin: 11 g/dL — ABNORMAL LOW (ref 12.0–15.0)
Lymphocytes Relative: 22 %
Lymphs Abs: 1.9 10*3/uL (ref 0.7–4.0)
MCH: 28.4 pg (ref 26.0–34.0)
MCHC: 32.9 g/dL (ref 30.0–36.0)
MCV: 86.3 fL (ref 80.0–100.0)
Monocytes Absolute: 0.4 10*3/uL (ref 0.1–1.0)
Monocytes Relative: 4 %
NEUTROS ABS: 5.8 10*3/uL (ref 1.7–7.7)
Neutrophils Relative %: 66 %
Platelets: 219 10*3/uL (ref 150–400)
RBC: 3.87 MIL/uL (ref 3.87–5.11)
RDW: 15.2 % (ref 11.5–15.5)
WBC: 8.8 10*3/uL (ref 4.0–10.5)
nRBC: 0.8 % — ABNORMAL HIGH (ref 0.0–0.2)

## 2018-03-21 LAB — COMPREHENSIVE METABOLIC PANEL
ALT: 40 U/L (ref 0–44)
AST: 45 U/L — ABNORMAL HIGH (ref 15–41)
Albumin: 2.1 g/dL — ABNORMAL LOW (ref 3.5–5.0)
Alkaline Phosphatase: 162 U/L — ABNORMAL HIGH (ref 38–126)
Anion gap: 9 (ref 5–15)
BUN: 25 mg/dL — ABNORMAL HIGH (ref 6–20)
CO2: 21 mmol/L — ABNORMAL LOW (ref 22–32)
CREATININE: 0.84 mg/dL (ref 0.44–1.00)
Calcium: 7.6 mg/dL — ABNORMAL LOW (ref 8.9–10.3)
Chloride: 109 mmol/L (ref 98–111)
GFR calc Af Amer: >60 mL/min (ref >60)
GFR calc non Af Amer: >60 mL/min (ref >60)
Glucose, Bld: 77 mg/dL (ref 70–99)
Potassium: 3.3 mmol/L — ABNORMAL LOW (ref 3.5–5.1)
Sodium: 139 mmol/L (ref 135–145)
Total Bilirubin: 0.7 mg/dL (ref 0.3–1.2)
Total Protein: 4.8 g/dL — ABNORMAL LOW (ref 6.5–8.1)

## 2018-03-21 MED ORDER — IBUPROFEN 800 MG PO TABS: 800.0000 mg | ORAL_TABLET | Freq: Three times a day (TID) | ORAL | 0 refills | Status: AC

## 2018-03-21 MED ORDER — OXYCODONE HCL 10 MG PO TABS: 10.0000 mg | ORAL_TABLET | Freq: Four times a day (QID) | ORAL | 0 refills | Status: AC | PRN

## 2018-03-21 MED ORDER — GABAPENTIN 100 MG PO CAPS: 100.0000 mg | ORAL_CAPSULE | Freq: Three times a day (TID) | ORAL | 0 refills | Status: AC | PRN

## 2018-03-21 NOTE — Lactation Note (Signed)
This note was copied from a baby's chart. Lactation Consultation Note  Patient Name: Bonnie Carey [id number] Date: 03/21/2018 Reason for consult: Follow-up assessment;Early term 37-38.6wks;Primapara;1st time breastfeeding  66 hours old early term female who is being mostly formula fed by his mother; she came as formula, but then changed her mind and decided she wanted to try BF. Mom has been very sick, when [facility] came for initial evaluation, mom was on pain meds and not very aware of what was she was told. A DEBP was left in the room to be set up later by her RN but mom never called for BF help, neither RN checked on mom's pumping status. Now mom is engorged and in a lot of pain, not just from her surgery but also from the engorgement at the breast.  Dad voiced he went to [employer] to get a "pump" just to have something to work with, since they couldn't figure out how to put together the DEBP kit. They are first time parents and didn't have a pump at home. Mom is a Spanish speaker and asked LC is the Medela hand pump from [employer] was OK to use. [place] set up the DEBP kit left in the room 3 days ago and showed parents how to use it. Instructions, cleaning and storage were reviewed, as well as milk storage guidelines.  Upon examination noticed that mom has flat/inverted nipples, the left one is the most noticeable one with remarkable areola edema, her tissue is non-compressible, LC only able to express one drop of colostrum out of the right breast; none came out of the left one. Set mom up with breast shells and a hand pump, instructions, cleaning and storage were reviewed, as well as milk storage guidelines.  Baby started crying while on [facility] consultation, and one of their visitors (mom wished them to stay) grabbed a bottle and started feeding it to baby while he was swaddled and lying down in the bassinet. [place] educated the family on infant feeding, baby has been getting large amounts of formula, showed  parents how to pace feed baby.  MD stepped in the room and informed mom she's getting discharged today. [place] reviewed discharge instructions, prevention and treatment for sore nipples and engorgement prevention and treatment.  Feeding plan:  1. Encouraged mom to treat engorgement with lots of ice, icing the breast for 20 minutes before attempting pumping. 2. Mom will pump every 3 hours and feed baby any amount of EBM she may get 3. Once engorgement episode is resolved, mom will attempt BF again 4. As engorgement resolves, she will also use her breast shells during the day time to help relieve areola edema  Parents reported all questions and concerns were answered, they're both aware of LC OP services and will contact PRN.  Maternal Data    Feeding Feeding Type: Formula Nipple Type: Slow - flow  Interventions Interventions: Breast feeding basics reviewed;Breast massage;Hand express;Reverse pressure;Breast compression;Hand pump;Shells;Ice;DEBP  Lactation Tools Discussed/Used Tools: Pump;Shells Shell Type: Inverted Breast pump type: Double-Electric Breast Pump;Manual Pump Review: Setup, frequency, and cleaning;Milk Storage Initiated by:: MPeck Date initiated:: 03/21/18   Consult Status Consult Status: Complete Date: 03/21/18 Follow-up type: Call as needed    [place] 03/21/2018, 11:59 AM

## 2018-03-21 NOTE — Progress Notes (Signed)
Interval Progress Note   Called to room by RN because of worsening rash. Used video interpreter for visit. Patient reports worsening rash for last few hours. States rash worsens when itches certain areas or puts pressure on area. Itchy. Denies shortness of breath at rest, no longer needing oxygen. Worried it may be related to blood transfusion or allergy.   On physical exam, has hives on upper extremities and on palms and thighs. Most likely related to resolving cholestasis of pregnancy and PUPPS. Unlikely delayed transfusion reaction, eosinophils on differential and normal bilirubin so no evidence of hemolysis. Will treat with Atarax and emollients with topical triamcinolone ointment.       Cristal DeerLaurel S. Earlene PlaterWallace, DO OB/GYN Fellow

## 2018-03-24 ENCOUNTER — Encounter: Payer: Self-pay | Admitting: Family Medicine

## 2018-03-24 ENCOUNTER — Other Ambulatory Visit: Payer: Self-pay

## 2018-03-28 ENCOUNTER — Inpatient Hospital Stay (HOSPITAL_COMMUNITY)
Admission: AD | Admit: 2018-03-28 | Discharge: 2018-03-29 | Disposition: A | Discharge status: Home / Self Care | Payer: Self-pay | Source: Ambulatory Visit | Attending: Obstetrics & Gynecology | Admitting: Obstetrics & Gynecology

## 2018-03-28 DIAGNOSIS — Z79899 Other long term (current) drug therapy: Secondary | ICD-10-CM | POA: Insufficient documentation

## 2018-03-28 DIAGNOSIS — M79659 Pain in unspecified thigh: Secondary | ICD-10-CM | POA: Insufficient documentation

## 2018-03-28 DIAGNOSIS — R42 Dizziness and giddiness: Secondary | ICD-10-CM | POA: Insufficient documentation

## 2018-03-28 DIAGNOSIS — Z791 Long term (current) use of non-steroidal anti-inflammatories (NSAID): Secondary | ICD-10-CM | POA: Insufficient documentation

## 2018-03-28 DIAGNOSIS — M549 Dorsalgia, unspecified: Secondary | ICD-10-CM | POA: Insufficient documentation

## 2018-03-28 DIAGNOSIS — R0600 Dyspnea, unspecified: Secondary | ICD-10-CM | POA: Insufficient documentation

## 2018-03-29 ENCOUNTER — Inpatient Hospital Stay (HOSPITAL_COMMUNITY): Payer: Self-pay

## 2018-03-29 ENCOUNTER — Other Ambulatory Visit: Payer: Self-pay

## 2018-03-29 ENCOUNTER — Encounter (HOSPITAL_COMMUNITY): Payer: Self-pay | Admitting: *Deleted

## 2018-03-29 DIAGNOSIS — M549 Dorsalgia, unspecified: Secondary | ICD-10-CM | POA: Diagnosis present

## 2018-03-29 LAB — URINALYSIS, ROUTINE W REFLEX MICROSCOPIC
Bilirubin Urine: NEGATIVE
Glucose, UA: NEGATIVE mg/dL
Hgb urine dipstick: NEGATIVE
Ketones, ur: NEGATIVE mg/dL
Leukocytes, UA: NEGATIVE
Nitrite: NEGATIVE
Protein, ur: NEGATIVE mg/dL
Specific Gravity, Urine: 1.015 (ref 1.005–1.030)
pH: 5 (ref 5.0–8.0)

## 2018-03-29 MED ORDER — CYCLOBENZAPRINE HCL 10 MG PO TABS
10.0000 mg | ORAL_TABLET | Freq: Once | ORAL | Status: AC
  Administered 2018-03-29: 10 mg via ORAL
  Filled 2018-03-29: qty 1

## 2018-03-29 NOTE — MAU Provider Note (Signed)
History     CSN: [id number]  Arrival date and time: 03/28/18 2350   First Provider Initiated Contact with Patient 03/29/18 0044 - video interpreter Jasmine DecemberSharon [phone] used for assessment and explanation of POC    Chief Complaint  Patient presents with  . Dizziness  . Leg Pain   HPI  Ms.  Bonnie Carey is a 37 y.o. year old [id number] female at 7211 PP, s/p primary CS for FTP who presents to MAU reporting H/A, anterior upper thigh pain, dizziness after taking Oxycodone. She states she was told she had "water on her lungs" and now she is having pain in her LT lung making it difficult to breathe. She states she had a procedure where they took fluid off her lungs while she was in the hospital. She denies any urinary complaints. She reports normal lochia.    Past Medical History:  Diagnosis Date  . Kidney stones     Past Surgical History:  Procedure Laterality Date  . CESAREAN SECTION N/A 03/18/2018   Procedure: CESAREAN SECTION;  Surgeon: Catalina Antiguaonstant, Peggy, MD;  Location: [facility];  Service: Obstetrics;  Laterality: N/A;  . NO PAST SURGERIES      No family history on file.  Social History   Tobacco Use  . Smoking status: Never Smoker  . Smokeless tobacco: Never Used  Substance Use Topics  . Alcohol use: Never    Frequency: Never  . Drug use: Never    Allergies: No Known Allergies  Medications Prior to Admission  Medication Sig Dispense Refill Last Dose  . diphenhydrAMINE (BENADRYL) 2 % cream Apply topically 3 (three) times daily as needed for itching. 30 g 0 Past Week at Unknown time  . gabapentin (NEURONTIN) 100 MG capsule Take 1 capsule (100 mg total) by mouth 3 (three) times daily as needed. 30 capsule 0   . hydrOXYzine (ATARAX/VISTARIL) 25 MG tablet Take 1 tablet (25 mg total) by mouth every 4 (four) hours as needed for itching. 30 tablet 0 Past Week at Unknown time  . ibuprofen (ADVIL,MOTRIN) 800 MG tablet Take 1 tablet (800 mg total) by mouth every 8  (eight) hours. 30 tablet 0   . oxyCODONE 10 MG TABS Take 1 tablet (10 mg total) by mouth every 6 (six) hours as needed for severe pain. 15 tablet 0   . Prenatal Multivit-Min-Fe-FA (PRENATAL VITAMINS PO) Take 1 tablet by mouth daily.    03/13/2018 at Unknown time    Review of Systems  Constitutional: Negative.   HENT: Negative.   Eyes: Negative.   Respiratory: Negative.   Cardiovascular: Negative.   Gastrointestinal: Negative.   Endocrine: Negative.   Genitourinary: Negative.   Musculoskeletal: Positive for back pain (upper to lower on LT side) and myalgias (anterior upper thigh pain).  Skin: Negative.   Allergic/Immunologic: Negative.   Neurological: Positive for dizziness (after taking oxycodone) and headaches (resolved with oxycodone).  Hematological: Negative.   Psychiatric/Behavioral: Negative.    Physical Exam   Blood pressure 116/76, pulse 83, temperature 98 F (36.7 C), temperature source Oral, resp. rate 17, weight 61.2 kg, unknown if currently breastfeeding.  Physical Exam  Nursing note and vitals reviewed. Constitutional: She is oriented to person, place, and time. She appears well-developed and well-nourished.  HENT:  Head: Normocephalic and atraumatic.  Eyes: Pupils are equal, round, and reactive to light.  Neck: Normal range of motion.  Cardiovascular: Normal rate, regular rhythm, normal heart sounds and intact distal pulses.  Respiratory: Effort normal. She has decreased  breath sounds in the right upper field, the right middle field, the right lower field, the left upper field, the left middle field and the left lower field.  GI: Soft. Bowel sounds are normal. There is CVA tenderness (LT side).  Genitourinary:    Genitourinary Comments: Pelvic deferred   Musculoskeletal: Normal range of motion.  Neurological: She is alert and oriented to person, place, and time. She has normal reflexes.  Skin: Skin is warm and dry.  Psychiatric: She has a normal mood and affect.  Her behavior is normal. Judgment and thought content normal.    MAU Course  Procedures  MDM I&O Cath UA CXR  Flexeril 10 mg -- "pain is all better"  Results for orders placed or performed during the hospital encounter of 03/28/18 (from the past 24 hour(s))  Urinalysis, Routine w reflex microscopic     Status: None   Collection Time: 03/29/18 12:50 AM  Result Value Ref Range   Color, Urine YELLOW YELLOW   APPearance CLEAR CLEAR   Specific Gravity, Urine 1.015 1.005 - 1.030   pH 5.0 5.0 - 8.0   Glucose, UA NEGATIVE NEGATIVE mg/dL   Hgb urine dipstick NEGATIVE NEGATIVE   Bilirubin Urine NEGATIVE NEGATIVE   Ketones, ur NEGATIVE NEGATIVE mg/dL   Protein, ur NEGATIVE NEGATIVE mg/dL   Nitrite NEGATIVE NEGATIVE   Leukocytes, UA NEGATIVE NEGATIVE   Dg Chest 2 View  Result Date: 03/29/2018 CLINICAL DATA:  Difficulty breathing EXAM: CHEST - 2 VIEW COMPARISON:  03/17/2018 FINDINGS: Cardiac shadows within normal limits. Previously seen infiltrates have resolved in the interval. No sizable effusion is noted. No bony abnormality is seen. IMPRESSION: Resolution of previously seen infiltrates. Electronically Signed   By: Alcide CleverMark  Lukens M.D.   On: 03/29/2018 01:21    Assessment and Plan  Other acute back pain - Plan: Discharge patient - Continue with medications previously prescribed - Information provided on back pain, back exercises, preventing back injuries - F/U with [facility] as scheduled - Discharge home - Patient verbalized an understanding of the plan of care and agrees.    [name], MSN, CNM 03/29/2018, 12:44 AM

## 2018-03-29 NOTE — MAU Note (Signed)
Pt presents to MAU c/o HA, leg pain on her anterior thigh, and dizziness. Pt reports her baby was born c-section on 03/18/18. Pt states the clinic states she has "water on her lungs and its hard to breathe."

## 2018-03-29 NOTE — Discharge Instructions (Signed)
Ejercicios para la espalda Back Exercises Si tiene dolor de espalda, haga estos ejercicios 2 o 3veces por da, o como se lo haya indicado el mdico. Cuando el dolor desaparezca, hgalos una vez por da, pero haga ms repeticiones de cada ejercicio. Si no le duele la espalda, haga estos ejercicios una vez por da o como se lo haya indicado el mdico. Ejercicios Rodilla al pecho Repita estos pasos 3 o 5veces seguidas con cada pierna: 1. Acustese boca arriba sobre una cama dura o sobre el suelo con las piernas extendidas. 2. Lleve una rodilla al pecho. 3. [place]. Para lograrlo tmese la rodilla o el muslo. 4. Tire de la rodilla hasta sentir una elongacin suave en la parte baja de la espalda. 5. Mantenga la elongacin durante 10 a 30segundos. 6. Suelte y extienda la pierna lentamente. Inclinacin de la pelvis Repita estos pasos 5 o 10veces seguidas: 1. Acustese boca arriba sobre una cama dura o sobre el suelo con las piernas extendidas. 2. Flexione las rodillas de manera que apunten al techo. Los pies deben estar apoyados en el suelo. 3. Contraiga los msculos de la parte baja del vientre (abdomen) para empujar la zona lumbar contra el suelo. Este movimiento har que el cccix apunte hacia el techo, en lugar de apuntar hacia abajo en direccin a los pies o al suelo. 4. Mantenga esta posicin durante 5 a 10segundos mientras contrae suavemente los msculos y respira con normalidad. El perro y el gato Repita estos pasos hasta que la zona lumbar se curve con ms facilidad: 1. [place] manos y las rodillas sobre una superficie firme. Las manos deben estar alineadas con los hombros y las rodillas con las caderas. Puede colocarse almohadillas debajo de las rodillas. 2. Deje caer la cabeza y lleve el cccix hacia abajo de modo que apunte en direccin al suelo para que la zona lumbar se arquee como el lomo de un gato [place]. 3. Mantenga esta posicin  durante 5segundos. 4. Lentamente, levante la cabeza y lleve el cccix hacia arriba de modo que apunte en direccin al techo para que la espalda se arquee (hunda) como el lomo de un perro contento. 5. Mantenga esta posicin durante 5segundos.  Flexiones de brazos Repita estos pasos 5 o 10veces seguidas: 1. Acustese boca abajo en el suelo. Hico manos cerca de la cabeza, separadas aproximadamente al ancho de los hombros. 3. Con la espalda relajada y las caderas apoyadas en el suelo, extienda lentamente los brazos para levantar la mitad superior del cuerpo y Community education officer los hombros. No use los msculos de la espalda. Para estar ms cmodo, puede cambiar la [employer]. 4. Mantenga esta posicin durante 5segundos. 5. Lentamente vuelva a la posicin horizontal.  Puentes Repita estos pasos 10veces seguidas: 1. Acustese boca arriba sobre una superficie firme. 2. Flexione las rodillas de manera que apunten al techo. Los pies deben estar apoyados en el suelo. 3. Contraiga los glteos y despegue las nalgas del suelo hasta que la cintura est casi a la altura de las rodillas. Si no siente el trabajo muscular en las nalgas y la parte posterior de los muslos, aleje los pies 1 o 2pulgadas (2,5 o 5centmetros) de las nalgas. 4. Mantenga esta posicin durante 3 a 5segundos. 5. Lentamente, vuelva a apoyar las nalgas en el suelo y relaje los glteos. Si este ejercicio le resulta muy fcil, intente realizarlo con los brazos cruzados [place]. Abdominales  Repita estos  pasos 5 o 10veces seguidas: 1. Acustese boca arriba sobre una cama dura o sobre el suelo con las piernas extendidas. 2. Flexione las rodillas de manera que apunten al techo. Los pies deben estar apoyados en el suelo. 3. Cruce los [employer]. 4. Baje levemente el mentn en direccin al pecho, pero no doble el cuello. 5. Contraiga los msculos del abdomen y con lentitud eleve el pecho lo suficiente como  para despegar levemente los omplatos del suelo. 6. Lentamente baje el pecho y la cabeza hasta el suelo. Elevaciones de espalda Repita estos pasos 5 o 10veces seguidas: 1. Acustese boca abajo con los brazos a los costados y apoye la frente en el suelo. 2. Contraiga los msculos de las piernas y los glteos. 3. Lentamente despegue el pecho del suelo mientras mantiene las caderas apoyadas en el suelo. Mantenga la nuca alineada con la curvatura de la espalda. Mire hacia el suelo mientras hace este ejercicio. 4. Mantenga esta posicin durante 3 a 5segundos. 5. Lentamente baje el pecho y el rostro hasta el suelo. Comunquese con un mdico si:  El dolor de espalda se vuelve mucho ms intenso cuando hace un ejercicio.  El dolor de espalda no se [place] 2horas despus de [employer] ejercicios. Si tiene alguno de [employer], deje de [employer] ejercicios. No vuelva a hacer los ejercicios a menos que el mdico lo autorice. Solicite ayuda de inmediato si:  Siente un dolor sbito y muy intenso en la espalda. Si esto ocurre, deje de [employer]. No vuelva a hacer los ejercicios a menos que el mdico lo autorice. Esta informacin no tiene Marine scientist el consejo del mdico. Asegrese de hacerle al mdico cualquier pregunta que tenga. Document Released: 07/09/2010 Document Revised: 12/19/2016 Document Reviewed: 05/18/2014 Elsevier Interactive Patient Education  2019 [place] de espalda agudo en los adultos Acute Back Pain, Adult El dolor de espalda agudo es repentino y por lo general no dura mucho tiempo. Se debe generalmente a una lesin de los msculos y tejidos de la espalda. La lesin puede ser el resultado de:  Estiramiento en exceso o desgarro (esguince) de un msculo o ligamento. Los ligamentos son tejidos que [employer]. Levantar algo de forma incorrecta puede producir un esguince de espalda.  Desgaste (degeneracin) de los discos vertebrales. Los  discos vertebrales son tejido Advertising account planner que proporciona acolchonamiento a los huesos de la columna vertebral (vrtebras).  Movimientos de giro, como al practicar deportes o realizar trabajos de [place].  Un golpe en la espalda.  Artritis. Es posible Hydrologist un examen fsico, anlisis de laboratorio u otros estudios de diagnstico por imgenes para Animator causa del Social research officer, government. El dolor de espalda agudo generalmente desaparece con reposo y cuidados en la casa. Sigue estas indicaciones en tu casa: Control del dolor, el entumecimiento y la hinchazn  Toma los medicamentos de venta libre y los recetados solamente como se lo haya indicado el mdico.  El mdico puede recomendarle que se aplique hielo durante las primeras 24a 48horas despus del comienzo del Social research officer, government. Para hacer esto: ? Ponga el hielo en una bolsa plstica. ? Coloque una [employer] piel y Therapist, nutritional. ? Coloque el hielo durante 55mnutos, 2 a 3veces por da.  Si se lo indican, aplique calor en la zona afectada con la frecuencia que le haya indicado el mdico. Use la fuente de calor que el mdico le recomiende, como una compresa de calor hmedo o una almohadilla  trmica. ? Colquese una [employer] piel y la fuente de Freight forwarder. ? Aplique calor durante 20 a 14mnutos. ? Retire la fuente de calor si la piel se pone de color rojo brillante. Esto es especialmente importante si no puede sentir dolor, calor o fro. Corre un mayor riesgo de sufrir quemaduras. Actividad   No permanezca en la cama. Hacer reposo en la cama por ms de 1 a 2 das puede demorar su recuperacin.  Mantenga una buena postura al sentarse y pararse. No se incline hacia adelante al sentarse ni se encorve al pararse. ? Si trabaja en un escritorio, sintese cerca de este para no tener que inclinarse. Mantenga el mentn hacia abajo. Mantenga el cuello hacia atrs y los codos flexionados en ngulo recto. La posicin de los brazos debe verse como la letra  L. ? Cuando conduzca, sintese elevado y cerca del volante. Agregue un apoyo para la espalda (lumbar) al asiento del automvil, si es necesario.  Realice caminatas cortas en superficies planas tan pronto como le sea posible. Trate de caminar un poco ms de tPublishing copy  No se siente, conduzca o permanezca de pie en un mismo lugar durante ms de 30 minutos seguidos. Pararse o sentarse durante largos perodos de tRadiographer, therapeuticla espalda.  No conduzca ni use maquinaria pesada mientras toma analgsicos recetados.  Use tcnicas apropiadas para levantar objetos. Cuando se inclina y lChief Executive Officerun oForest Home utilice posiciones que no sobrecarguen tanto la espalda: ? FBrea ? Mantenga la carga cerca del cuerpo. ? No gire el cuerpo.  Haga actividad fsica habitualmente como se lo haya indicado el mdico. Hacer ejercicios ayuda a que la espalda sane ms rpido y aSaint Helenaa eProduct/process development scientistlas lesiones de la espalda al mFamily Dollar Storesmsculos fuertes y flexibles.  Trabaje con un fisioterapeuta para crear un programa de ejercicios seguros, segn lo recomiende el mdico. Haga ejercicios como se lo haya indicado el fisioterapeuta. Estilo de vida  Mantenga un peso saludable. El sobrepeso sobrecarga la espalda y hace que resulte difcil tener una buena pGilbert  Evite actividades o situaciones que lo hagan sentirse ansioso o estresado. El estrs y la ansiedad aumentan la tensin muscular y pueden empeorar el dolor de espalda. Aprenda formas de mThrivent Financialansiedad y e[place] como a travs del ejercicio. Indicaciones generales  Duerma sobre un colchn firme en una posicin cmoda. Intente acostarse de costado, con las rodillas ligeramente flexionadas. Si se recuesta sSmith International coloque una almohada debajo de las rodillas.  Siga el plan de tratamiento como se lo haya indicado el mdico. Puede incluir: ? Terapia cognitiva o conductual. ? Acupuntura o terapia de masajes. ? Yoga o  meditacin. Comuncate con un mdico si:  Siente un dolor que no se alivia con reposo o medicamentos.  Siente mucho dolor que se extiende a las piernas o las nalgas.  El dolor no mejora luego de 2 semanas.  Siente dolor por la noche.  Pierde peso sin proponrselo.  Tiene fiebre o escalofros. Solicite ayuda inmediatamente si:  Tiene nuevos problemas para controlar la vejiga o los intestinos.  Siente debilidad o adormecimiento inusuales en los brazos o en las piernas.  Siente nuseas o vmitos.  Siente dolor abdominal.  Siente que va a desmayarse. Resumen  El dolor de espalda agudo es repentino y por lo general no dura mucho tiempo.  Use tcnicas apropiadas para levantar objetos. Cuando se inclina y levanta un oSt. Leonard utilice posiciones que no sobrecarguen tanto la espalda.  Tome los medicamentos de venta libre o recetados y aplique calor o hielo solamente como se lo haya indicado el mdico. Esta informacin no tiene Marine scientist el consejo del mdico. Asegrese de hacerle al mdico cualquier pregunta que tenga. Document Released: 03/24/2005 Document Revised: 10/30/2017 Document Reviewed: 01/08/2017 Elsevier Interactive Patient Education  2019 [place] lesiones de la espalda Back Injury Prevention Las lesiones de la espalda pueden ser muy dolorosas. Adems son difciles de curar. Despus de haber tenido una lesin de la espalda, tiene ms probabilidad de lesionarse otra vez. Es importante que aprenda cmo evitar lesionarse o volver a Control and instrumentation engineer. Los siguientes consejos pueden ayudarlo a Product/process development scientist una lesin de la espalda. Qu medidas puedo tomar para evitar lesiones de la espalda? Cambios en la dieta Converse con el mdico sobre su dieta general, y especialmente sobre los alimentos que fortalecen los [place].  Consulte a su mdico sobre la cantidad de calcio y vitaminaD que necesita a diario. Estos nutrientes ayudan a Product manager de los huesos (osteoporosis). La osteoporosis puede derivar en fracturas seas, lo que puede causar dolor de espalda.  Coma alimentos que sean buenas fuentes de calcio. Estas incluyen productos lcteos, verduras de hojas verdes y productos con agregado de calcio (fortificados).  Coma alimentos que sean buenas fuentes de vitamina D. [place] y alimentos fortificados con vitamina D.  De ser necesario, tome vitaminas y suplementos segn las indicaciones del mdico. Aptitud fsica La aptitud fsica fortalece los huesos y los msculos. Tambin aumenta el equilibrio y Pharmacologist.  Haga ejercicios durante 36mnutos diarios la mHartford Financialde la sCanyon [place] o como se lo haya indicado el mdico. AChief Strategy Officerde lo siguiente: ? Haga ejercicios aerbicos, como caminar, trotar, andar en bicicleta o nadar. ? Haga ejercicios que mejoren el equilibrio y la fuerza, cYorkshiretai chi y el yoga. Estos pueden reducir el riesgo de que se caiga y se lesione la espalda. ? Haga ejercicios de elongacin para ayudar a la flexibilidad. ? Desarrolle msculos abdominales fuertes. Los msculos del abdomen brindan gran parte del sostn que necesita la espalda.  Mantenga un peso saludable. Esto ayuda a reducir eCatering managerde sufrir una lesin de la espalda. BBristol-Myers Squibblesiones de la espalda adoptando y m[place] Para hacerlo con xito:  Mantenga una buena postura al sentarse y pararse. No se incline hacia adelante al sentarse ni se encorve al pararse.  Elija las sillas con un buen apoyo para la zona baja de la espalda (lumbar).  Si trabaja en un escritorio, sintese cerca de este para no tener que inclinarse. Mantenga el mentn hacia abajo. Mantenga el cuello hacia atrs y los codos flexionados en ngulo recto.  Cuando conduzca, sintese elevado y cerca del volante. Agregue un apoyo para la zona lumbar al asiento del automvil, si es  necesario.  No permanezca sentado ni parado en una posicin durante mThe PNC Financial Tmese descansos para pararse, estirarse y caminar, al menos una vez por hora. Tmese descansos cada hora si conduce durante perodos largos de tiempo.  Duerma de costado con las rodillas apenas dobladas o boca arriba con una almohada debajo de las rodillas.  Cmo levantar peso, doblarse y estirarse Es ms probable que las lesiones de la espalda se produzcan cuando lleva peso y rota al mAutoZone Cuando se incline y levante peso, o se estire para aImmunologistque  estn en estantes altos, utilice posiciones que no sobrecarguen tanto la espalda.  Levantamiento de peso excesivo ? Evite levantar cargas pesadas, especialmente el levantamiento repetitivo de objetos pesados. Si debe levantar cargas pesadas: ? Elongue antes de hacerlo. ? Trabaje lentamente. ? Descanse despus de cada levantamiento. ? Use una herramienta, como un carrito o una plataforma mvil para mover los [place]. ? Haga varios viajes cortos, en lugar de llevar una carga pesada. ? Pida ayuda cuando la necesite, en especial cuando mueva objetos de gran tamao o pesados. ? Siga estos pasos cuando levante objetos: ? Prese con los pies separados al ancho de los hombros. ? Acrquese al [employer] pueda. No intente levantar un objeto pesado que est lejos de su cuerpo. ? Use agarraderas o correas de elevacin si estn disponibles. ? [place]. [place], pero mantenga los Henry [employer]in. ? Mantenga los PG[employer]E Corporation, el mentn hacia abajo y la espalda derecha. ? Levante lentamente el International Paper contrae los msculos de las piernas, el abdomen y las nalgas. Mantenga el objeto tan cerca del centro del cuerpo como sea posible. ? Siga estos pasos cuando baje una carga pesada: ? Prese con los pies separados al ancho de los hombros. ? Baje lentamente el objeto mientras contrae los msculos de las piernas, el abdomen y  las nalgas. Mantenga el objeto tan cerca del centro del cuerpo como sea posible. ? Mantenga los PG[employer] Corporation, el mentn hacia abajo y la espalda derecha. ? [place]. [place], pero mantenga los Henry [employer]in. ? Use agarraderas o correas de elevacin si estn disponibles.  Movimientos de torsin y de estiramiento ? No levante objetos pesados por encima del nivel de la cintura. ? No tuerza la cintura mientras levanta o transporta una carga. Si tiene que girar, Sears Holdings Corporation. ? No se incline sin flexionar las rodillas. ? No se estire para alcanzar un objeto que est por encima de su cabeza, al otro lado de una mesa o sobre una superficie elevada.  Otros cambios   The Pro[employer]er [employer] Gam[employer] mojados y los suelos helados. Retire el hielo de las aceras para evitar las cadas.  No duerma sobre un colchn muy blando ni muy duro.  Coloque los objetos ms pesados en estantes a nivel de la cintura y los ms livianos en estantes ms bajos o ms altos.  Encuentre formas de reducir Dealer, como hacer ejercicios, darse masajes o Psychologist, prison and probation services tcnicas de relajacin. El estrs puede [employer] Foods. Los msculos en estado de tensin son ms propensos a las lesiones.  Hable con el mdico si se siente ansioso o deprimido. Estas afecciones pueden intensificar el dolor de espalda.  Use calzado sin tacones con suelas acolchonadas.  Use ambas correas de sujecin cuando cargue una mochila.  No consuma ningn producto que contenga nicotina o tabaco, como cigarrillos y Psychologist, sport and exercise. Si necesita ayuda para dejar de consumir, consulte al [employer]. Resumen  Las lesiones de la espalda pueden ser muy dolorosas y son difciles de Customer service manager.  Puede prevenir lesionarse o volver a lesionarse la espalda haciendo cambios nutricionales, trabajando para mantenerse en buen [place] fsico, adoptando una buena postura y levantando objetos pesados de forma segura.  Hacer otros cambios puede  tambin ayudar a prevenir lesiones de la espalda. Estos incluyen seguir una dieta saludable, hacer ejercicios regularmente y Theatre manager un peso saludable. Esta informacin no tiene Marine scientist el consejo del mdico. Asegrese de hacerle al mdico cualquier pregunta que  tenga. Document Released: 03/24/2005 Document Revised: 07/04/2017 Document Reviewed: 07/04/2017 Elsevier Interactive Patient Education  2019 [employer].

## 2018-04-01 ENCOUNTER — Encounter: Payer: Self-pay | Admitting: Obstetrics and Gynecology

## 2018-04-01 ENCOUNTER — Ambulatory Visit (INDEPENDENT_AMBULATORY_CARE_PROVIDER_SITE_OTHER): Payer: Self-pay | Admitting: Obstetrics and Gynecology

## 2018-04-01 ENCOUNTER — Other Ambulatory Visit: Payer: Self-pay

## 2018-04-01 VITALS — BP 116/77 | HR 95 | Wt 128.1 lb

## 2018-04-01 DIAGNOSIS — Z789 Other specified health status: Secondary | ICD-10-CM

## 2018-04-01 DIAGNOSIS — R5383 Other fatigue: Secondary | ICD-10-CM

## 2018-04-01 DIAGNOSIS — R42 Dizziness and giddiness: Secondary | ICD-10-CM

## 2018-04-01 NOTE — Progress Notes (Signed)
Pt here for wound check.  On assessment, discharge noted to be coming from wound.  Consulted Dr. Vergie LivingPickens who cleaned wound and applied silver nitrate.  Pt also reporting dizziness that is worse in the morning but sometimes lasts all day.  Per Dr. Vergie LivingPickens, will check hemoglobin.  Wound care reviewed with pt.  Pt to follow up in 1 week with either Dr. Jolayne Pantheronstant or Dr. Vergie LivingPickens

## 2018-04-02 DIAGNOSIS — Z789 Other specified health status: Secondary | ICD-10-CM | POA: Insufficient documentation

## 2018-04-02 LAB — BASIC METABOLIC PANEL
BUN/Creatinine Ratio: 25 — ABNORMAL HIGH (ref 9–23)
BUN: 20 mg/dL (ref 6–20)
CHLORIDE: 101 mmol/L (ref 96–106)
CO2: 23 mmol/L (ref 20–29)
Calcium: 9.4 mg/dL (ref 8.7–10.2)
Creatinine, Ser: 0.8 mg/dL (ref 0.57–1.00)
GFR calc Af Amer: 110 mL/min/{1.73_m2} (ref >59)
GFR calc non Af Amer: 95 mL/min/{1.73_m2} (ref >59)
Glucose: 84 mg/dL (ref 65–99)
Potassium: 4.5 mmol/L (ref 3.5–5.2)
Sodium: 137 mmol/L (ref 134–144)

## 2018-04-02 LAB — CBC
Hematocrit: 41.5 % (ref 34.0–46.6)
Hemoglobin: 14 g/dL (ref 11.1–15.9)
MCH: 28.3 pg (ref 26.6–33.0)
MCHC: 33.7 g/dL (ref 31.5–35.7)
MCV: 84 fL (ref 79–97)
PLATELETS: 565 10*3/uL — AB (ref 150–450)
RBC: 4.95 x10E6/uL (ref 3.77–5.28)
RDW: 15.6 % — AB (ref 12.3–15.4)
WBC: 9.2 10*3/uL (ref 3.4–10.8)

## 2018-04-02 NOTE — Progress Notes (Signed)
Obstetrics and Gynecology Visit Return Patient Evaluation  Appointment Date: 04/02/2018  Primary Care Provider: Patient, No Pcp Per  [facility]  Chief Complaint: routine incision check with RN  History of Present Illness:  Bonnie LundborgClaudia Gaytan Carey is a 37 y.o. G1P1001 s/p 03/18/2018 pLTCS for arrest of descent. Preg c/b cholestasis, chorio, AKI, pulmonary edema  I was asked to inspect her incision due to concern it could be infected.  Patient notes some lethargy in the morning.   Review of Systems: as noted in the History of Present Illness.   Patient Active Problem List   Diagnosis Date Noted  . Back pain 03/29/2018  . Postoperative anemia 03/19/2018  . Pulmonary edema 03/19/2018  . Elevated serum creatinine 03/19/2018  . S/P cesarean section 03/19/2018  . Supervision of high risk pregnancy, antepartum 02/08/2018  . Cholestasis during pregnancy in third trimester 02/08/2018  . Advanced maternal age, primigravida in first trimester, antepartum    Medications:  Bonnie LundborgClaudia Gaytan Carey had no medications administered during this visit. Current Outpatient Medications  Medication Sig Dispense Refill  . diphenhydrAMINE (BENADRYL) 2 % cream Apply topically 3 (three) times daily as needed for itching. 30 g 0  . gabapentin (NEURONTIN) 100 MG capsule Take 1 capsule (100 mg total) by mouth 3 (three) times daily as needed. 30 capsule 0  . hydrOXYzine (ATARAX/VISTARIL) 25 MG tablet Take 1 tablet (25 mg total) by mouth every 4 (four) hours as needed for itching. 30 tablet 0  . ibuprofen (ADVIL,MOTRIN) 800 MG tablet Take 1 tablet (800 mg total) by mouth every 8 (eight) hours. 30 tablet 0  . oxyCODONE 10 MG TABS Take 1 tablet (10 mg total) by mouth every 6 (six) hours as needed for severe pain. 15 tablet 0  . Prenatal Multivit-Min-Fe-FA (PRENATAL VITAMINS PO) Take 1 tablet by mouth daily.      No current facility-administered medications for this visit.      Allergies: has No Known Allergies.  Physical Exam:  BP 116/77   Pulse 95   Wt 128 lb 1.6 oz (58.1 kg)   Breastfeeding No   BMI 25.02 kg/m  Body mass index is 25.02 kg/m. General appearance: Well nourished, well developed female in no acute distress.  Abdomen: minimally ttp diffusely, no peritoneal s/s, +BS, soft, nd. C/d/i incision except on the right lateral edge there is approximately 2cm area and in the midline there is 3cm area as described below: both areas have the bottom edge of the skin that has granulation and is unable to heal b/c the top part of the incision has tucked into the incision.  After steri strips were removed, the incision was inspected and then cleaned with betadine and water. Next silver nitrate was applied to the above areas  Neuro/Psych:  Normal mood and affect.      Assessment: pt doing well  Plan:  1. Lethargy Check labs - CBC - Basic metabolic panel  2. Post op  RTC: 7-10d for incision check  Interpreter used  Cornelia Copaharlie Riane Rung, Jr MD Attending [facility](Faculty Practice)

## 2018-04-09 ENCOUNTER — Ambulatory Visit (INDEPENDENT_AMBULATORY_CARE_PROVIDER_SITE_OTHER): Payer: Self-pay | Admitting: Obstetrics and Gynecology

## 2018-04-09 ENCOUNTER — Other Ambulatory Visit: Payer: Self-pay | Admitting: Student

## 2018-04-09 VITALS — BP 107/71 | HR 72 | Wt 131.0 lb

## 2018-04-09 DIAGNOSIS — Z09 Encounter for follow-up examination after completed treatment for conditions other than malignant neoplasm: Secondary | ICD-10-CM

## 2018-04-09 MED ORDER — OXYCODONE-ACETAMINOPHEN 5-325 MG PO TABS: 1.0000 | ORAL_TABLET | Freq: Four times a day (QID) | ORAL | 0 refills | Status: AC | PRN

## 2018-04-09 NOTE — Progress Notes (Signed)
Obstetrics and Gynecology Visit Return Patient Evaluation  Appointment Date: 04/09/2018  Primary Care Provider: Patient, No Pcp Per  [facility]  Chief Complaint: incision check  History of Present Illness:  Clyde LundborgClaudia Gaytan Ibarra is a 38 y.o. G1P1001 s/p 03/18/2018 pLTCS for arrest of descent. Preg c/b cholestasis, chorio, AKI, pulmonary edema. Patient states still very uncomfortable at the incision.   Review of Systems:  as noted in the History of Present Illness.  Patient Active Problem List   Diagnosis Date Noted  . Language barrier 04/02/2018  . Back pain 03/29/2018  . Postoperative anemia 03/19/2018  . Pulmonary edema 03/19/2018  . Elevated serum creatinine 03/19/2018  . S/P cesarean section 03/19/2018  . Supervision of high risk pregnancy, antepartum 02/08/2018  . Cholestasis during pregnancy in third trimester 02/08/2018  . Advanced maternal age, primigravida in first trimester, antepartum    Medications:  Clyde LundborgClaudia Gaytan Ibarra had no medications administered during this visit. Current Outpatient Medications  Medication Sig Dispense Refill  . oxyCODONE 10 MG TABS Take 1 tablet (10 mg total) by mouth every 6 (six) hours as needed for severe pain. 15 tablet 0  . diphenhydrAMINE (BENADRYL) 2 % cream Apply topically 3 (three) times daily as needed for itching. (Patient not taking: Reported on 04/09/2018) 30 g 0  . gabapentin (NEURONTIN) 100 MG capsule Take 1 capsule (100 mg total) by mouth 3 (three) times daily as needed. (Patient not taking: Reported on 04/09/2018) 30 capsule 0  . hydrOXYzine (ATARAX/VISTARIL) 25 MG tablet Take 1 tablet (25 mg total) by mouth every 4 (four) hours as needed for itching. (Patient not taking: Reported on 04/09/2018) 30 tablet 0  . ibuprofen (ADVIL,MOTRIN) 800 MG tablet Take 1 tablet (800 mg total) by mouth every 8 (eight) hours. (Patient not taking: Reported on 04/09/2018) 30 tablet 0  . Prenatal Multivit-Min-Fe-FA  (PRENATAL VITAMINS PO) Take 1 tablet by mouth daily.      No current facility-administered medications for this visit.     Allergies: has No Known Allergies.  Physical Exam:  BP 107/71   Pulse 72   Wt 131 lb (59.4 kg)   BMI 25.58 kg/m  Body mass index is 25.58 kg/m. General appearance: Well nourished, well developed female in no acute distress.  CV: no MRGs, normal s1 and s2 Pulmonary: CTAB Abdomen: +BS, soft, nd. ttp at the incision and lower abdomen but no peritoneal s/s. Incision with no e/o hernia and healing areas with some granulation tissue at prior sites from last week Neuro/Psych:  Normal mood and affect.    Assessment: pt stable  Plan: F/u one week. May need to remove some excess suture loose strands at next visit. Unsure why some ttp at the incision. Continue to follow closely. Refill for narcotics given. Database checked and none given since d/c from hospital. Unable to use e prescribe due to program freezing with epic. Paper Rx given for percocet 5/325 #30.   Interpreter used RTC: 1wk  Cornelia Copaharlie Clista Rainford, Jr MD Attending [facility] Mid[facility](Faculty Practice)

## 2018-04-13 ENCOUNTER — Encounter: Payer: Self-pay | Admitting: *Deleted

## 2018-04-15 ENCOUNTER — Ambulatory Visit: Payer: Self-pay | Admitting: Clinical

## 2018-04-15 ENCOUNTER — Telehealth: Payer: Self-pay | Admitting: Clinical

## 2018-04-15 ENCOUNTER — Encounter: Payer: Self-pay | Admitting: Obstetrics and Gynecology

## 2018-04-15 ENCOUNTER — Ambulatory Visit (INDEPENDENT_AMBULATORY_CARE_PROVIDER_SITE_OTHER): Payer: Self-pay | Admitting: Obstetrics and Gynecology

## 2018-04-15 NOTE — BH Specialist Note (Signed)
Pt declined visit.

## 2018-04-15 NOTE — Progress Notes (Signed)
Subjective:     Bonnie Carey is a 38 y.o. female who presents for a postpartum visit. She is 4 weeks postpartum following a low cervical transverse Cesarean section. I have fully reviewed the prenatal and intrapartum course. The delivery was at [redacted]w[redacted]d gestational weeks. Outcome: primary cesarean section, low transverse incision. Anesthesia: spinal. Postpartum course has been complicated by pulmonary edema. Baby's course has been uncomplicated. Baby is feeding by bottle Rush Barer. Bleeding thin lochia. Bowel function is abnormal: constipation.  Pt is not taking any medication, she is eating fruits.  . Bladder function is abnormal: burning with urination, urgency, and odor since delivery. Patient is not sexually active. Contraception method is abstinence. Postpartum depression screening: negative.     Review of Systems Pertinent items are noted in HPI.   Objective:    BP 112/67   Pulse 89   Ht 4\' 10"  (1.473 m)   Wt 125 lb 4.8 oz (56.8 kg)   BMI 26.19 kg/m   General:  alert, cooperative and no distress   Breasts:  inspection negative, no nipple discharge or bleeding, no masses or nodularity palpable  Lungs: clear to auscultation bilaterally  Heart:  regular rate and rhythm  Abdomen: soft, non-tender; bowel sounds normal; no masses,  no organomegaly and incision: healed completely without erythema, induration or drainage   Vulva:  normal  Vagina: normal vagina, no discharge, exudate, lesion, or erythema  Cervix:  multiparous appearance  Corpus: normal size, contour, position, consistency, mobility, non-tender  Adnexa:  normal adnexa and no mass, fullness, tenderness  Rectal Exam: Not performed.        Assessment:     Normal postpartum exam. Pap smear not done at today's visit.   Plan:    1. Contraception: contemplating depo or Nexplanon. Will follow up with health department as she is self pay 2. Patient is medically cleared to resume all activities of daily living. Urine  culture collected to rule out UTI. Patient will be contacted with results 3. Follow up in: as needed.

## 2018-04-15 NOTE — Telephone Encounter (Signed)
Called pt(via [facility] Edson Snowball [phone])  to follow-up after missed same-day referral; pt declined visit, as she was "upset about test results" during medical visit only. Pt declines [facility] appointment at this time.

## 2018-04-19 ENCOUNTER — Other Ambulatory Visit: Payer: Self-pay | Admitting: Obstetrics and Gynecology

## 2018-04-19 ENCOUNTER — Telehealth: Payer: Self-pay

## 2018-04-19 LAB — URINE CULTURE

## 2018-04-19 MED ORDER — NITROFURANTOIN MONOHYD MACRO 100 MG PO CAPS: 100.0000 mg | ORAL_CAPSULE | Freq: Two times a day (BID) | ORAL | 1 refills | Status: AC

## 2018-04-19 NOTE — Telephone Encounter (Signed)
-----   Message from Catalina Antigua, MD sent at 04/19/2018  4:54 PM EST ----- Please inform patient of UTI. Rx has been e-prescribed to [facility] pharmacy

## 2018-04-19 NOTE — Telephone Encounter (Signed)
Called pt to advise that she has a UTI & Rx was sent to her pharmacy on file. Used [employer] Zollie Beckers ID# [phone] to relay message. Pt verbalized understanding.

## 2018-04-27 ENCOUNTER — Ambulatory Visit: Payer: Self-pay | Admitting: Student

## 2018-12-09 IMAGING — US US FETAL BPP W/ NON-STRESS
1 series · 12 of 12 positions shown · non-contrast
Comparison: none

[Series 1: us fetal bpp w/nonstress · 12 acquisitions, 12 frames shown]
[im 1/12]
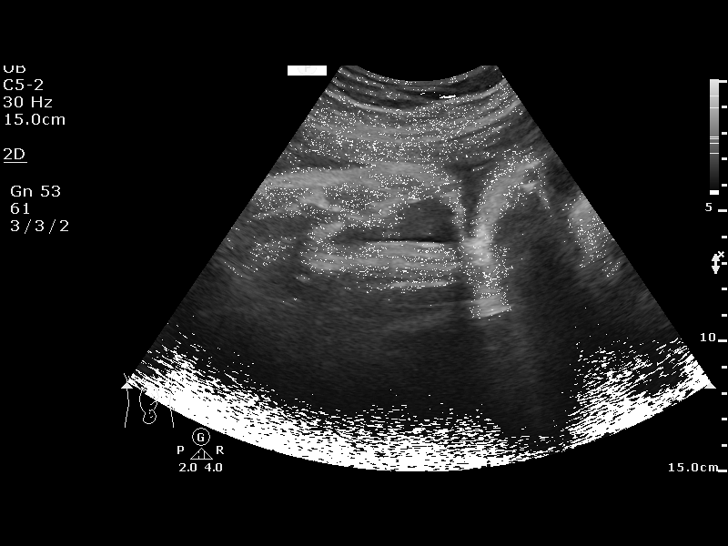
[im 2/12]
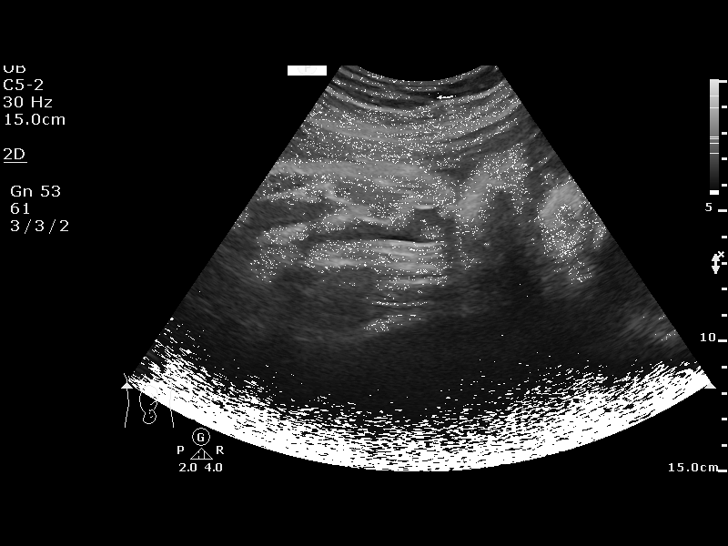
[im 3/12]
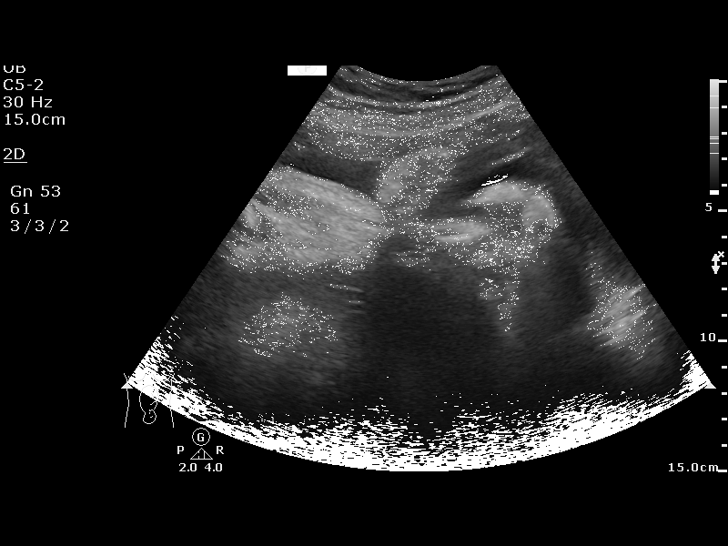
[im 4/12]
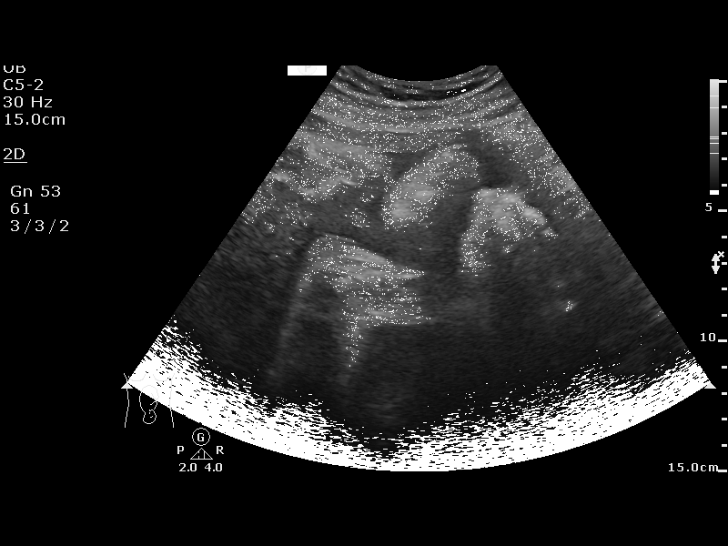
[im 5/12]
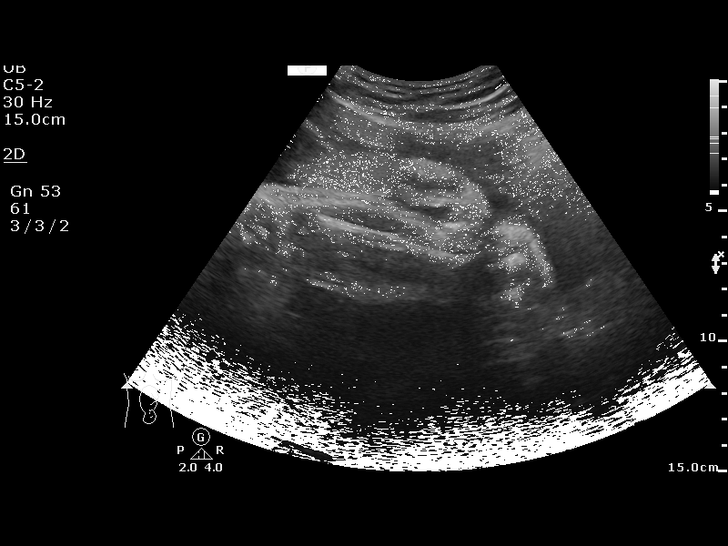
[im 6/12]
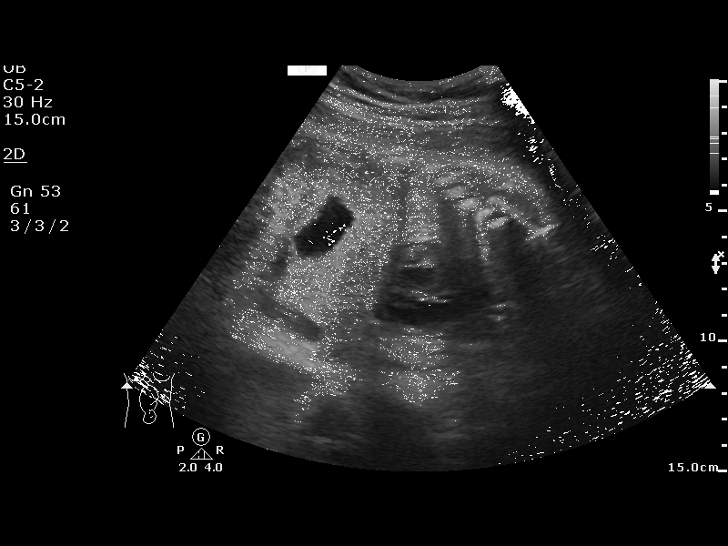
[im 7/12]
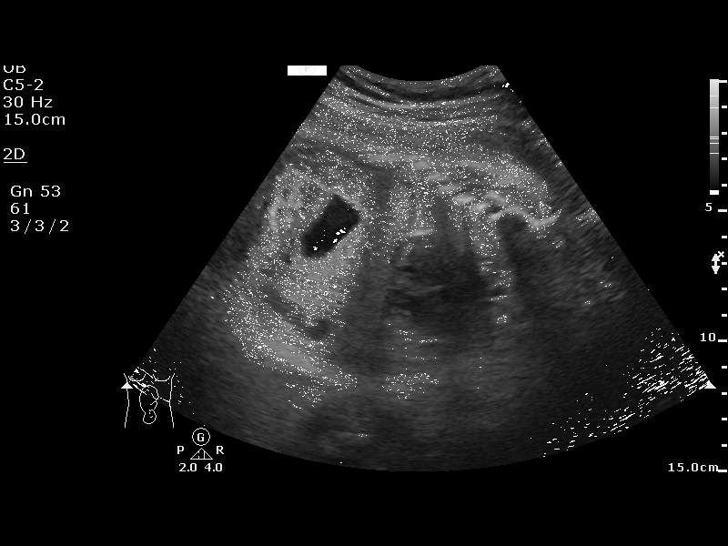
[im 8/12]
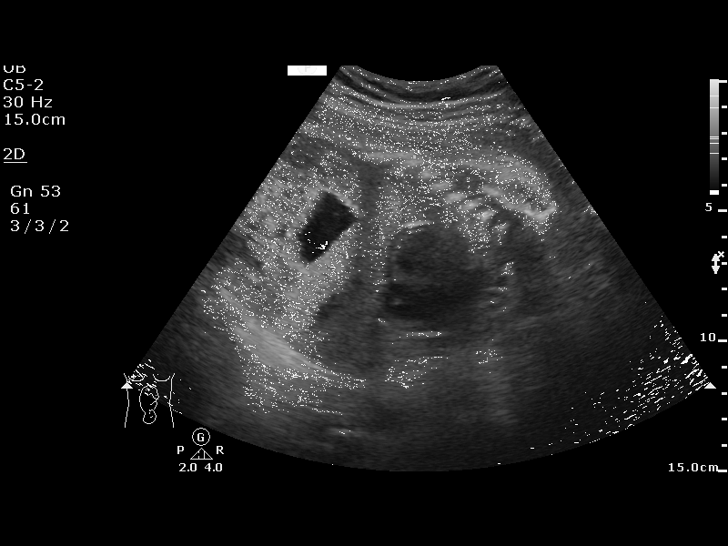
[im 9/12]
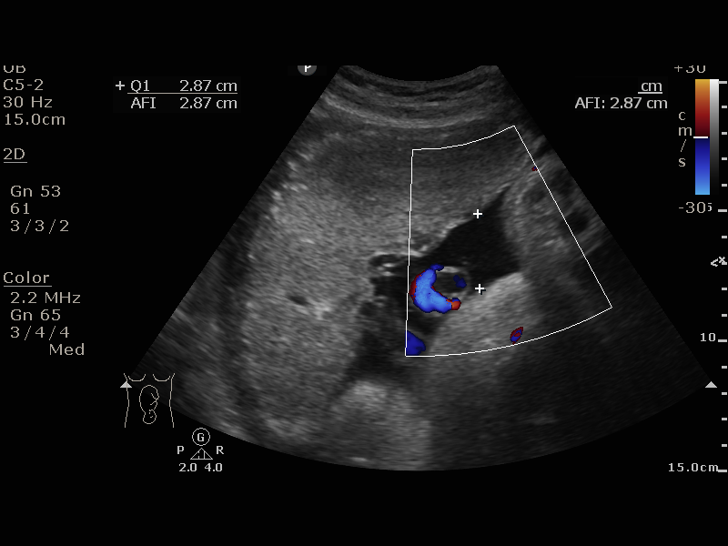
[im 10/12]
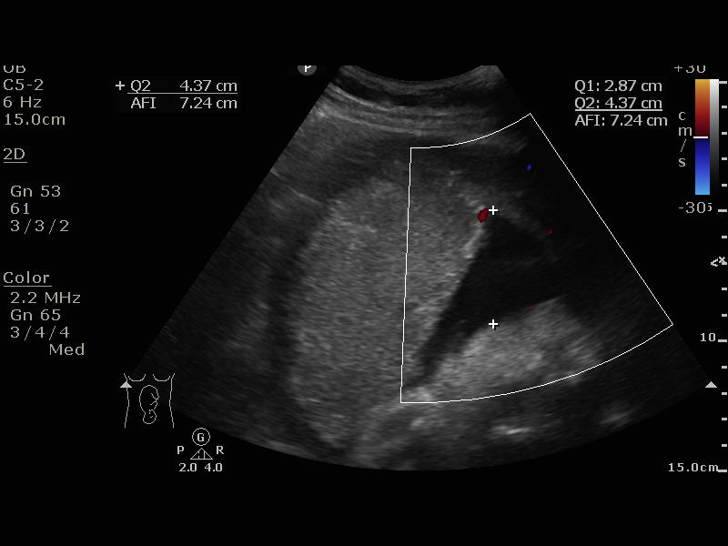
[im 11/12]
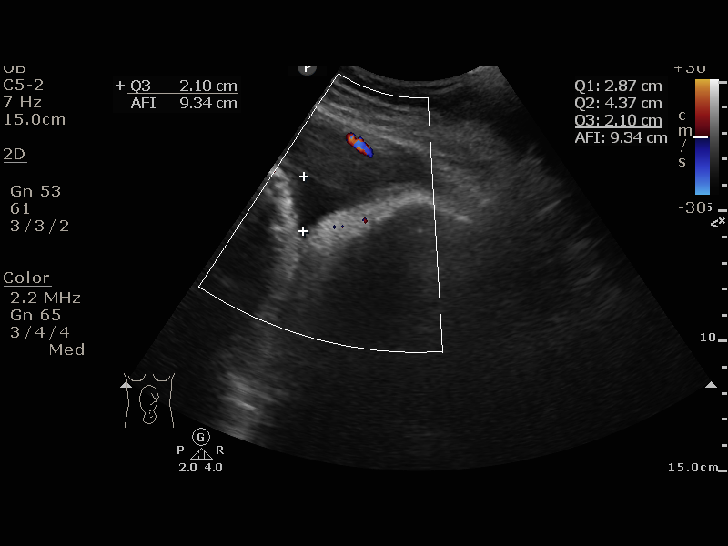
[im 12/12]
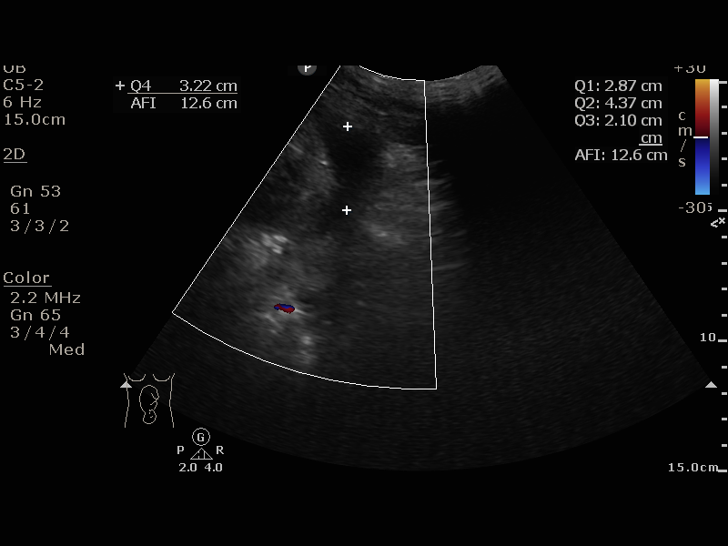

[12 of 12 positions shown; findings below may reference images not displayed]

OB/Gyn Clinic
                                                            Women's
                                                            [REDACTED]

  1  US FETAL BPP W/NONSTRESS             76818.4      CAITY MOMPREMIER
 ----------------------------------------------------------------------

 ----------------------------------------------------------------------
Service(s) Provided

 ----------------------------------------------------------------------
Indications

  34 weeks gestation of pregnancy
  Cholestasis of pregnancy, third trimester      [id number]
 ----------------------------------------------------------------------
Fetal Evaluation

 Num Of Fetuses:         1
 Preg. Location:         Intrauterine
 Cardiac Activity:       Observed
 Presentation:           Cephalic

 Amniotic Fluid
 AFI FV:      Within normal limits

 AFI Sum(cm)     %Tile       Largest Pocket(cm)
 12.56           39

 RUQ(cm)       RLQ(cm)       LUQ(cm)        LLQ(cm)

Biophysical Evaluation

 Amniotic F.V:   Pocket => 2 cm two         F. Tone:        Observed
                 planes
 F. Movement:    Observed                   N.S.T:          Reactive
 F. Breathing:   Observed                   Score:          [DATE]
OB History

 Gravidity:    1
Gestational Age

 LMP:           34w 3d        Date:  06/28/17                 EDD:   04/04/18
 Best:          34w 3d     Det. By:  LMP  (06/28/17)          EDD:   04/04/18
Impression

 BPP [DATE]
Recommendations

 Continue antepartum testing
                 Pelico, Aristotle

## 2020-04-07 NOTE — L&D Delivery Note (Addendum)
[name]  [place], [place] [address]    Delivery Summary Note - Cesarean Section      Name: [name]   MRN: [id number]  DOB:  07-30-1980  Admitted: 12/01/2020  9:52 PM  Date of Service: 12/02/2020       Pre-delivery diagnosis:      40 y.o. G2P1001 at [redacted]w[redacted]d gestation with:  1. Active Labor  2. Prior CSX1  3. AMA  4. LGA  5. Declines TOLAC      Post-delivery diagnosis:  Same plus delivery of a viable neonate.     Procedure: Repeat Low Transverse Cesarean Delivery    Prophylaxis:  Antibiotic: Ancef, Azithromycin              Thromboembolic: Venodynes    Findings: Normal appearing uterus, tubes and ovaries. Filmy adhesions between anterior abdominal wall and uterus.    Fetal findings as indicated below.       Attending(s): Dawn Belling, MD    Assistant(s): [name], CNM    Estimated Blood Loss: 600 ml    Intraoperative Fluids: 750cc ml Crystalloid    Intraoperative Obstetric Medications: oxytocin, methergine    Complications: None    Pathology Specimen(s): Cord Blood    Drains: Foley Catheter with 50cc of clear urine    Disposition: Recovery in Labor and Delivery    Condition: Stable    Operative Detail:           After informed consent, the patient was taken to the operative suite where adequate anesthesia was obtained.  She was placed in the dorsal supine position with leftward tilt.  A foley catheter was placed and noted to be draining clear urine. The patient was prepped and draped in sterile fashion for cesarean delivery.  A time out was performed to confirm the correct patient and procedure and that antibiotics had been administered and venodynes were applied and functioning.      A Pfannenstiel incision was made with a #10 blade scalpel through the skin, subcuticular layer, and the fascia. The fascial incision was extended bilaterally with curved Mayo scissors.  Kocher clamps were used to elevate the superior aspect of the fascia while curved Mayo scissors were used to dissect  away the rectus muscles.  The same was performed inferiorly.  The rectus muscles were divided in the midline bluntly. The peritoneum was entered bluntly and this incision extended with blunt dissection.  A bladder blade was placed.  The vesicouterine peritoneum was entered with Metzenbaum scissors.  This incision was extended bilaterally with sharp dissection and the bladder flap created.  The bladder blade was replaced to retract the bladder away.       A #10 blade scalpel was used to make a transverse incision on the lower uterine segment.  The incision was extended with blunt dissection. The fetal presenting part was elevated through the incision and then delivered from a vertex presentation without difficulty.  The nose and mouth were bulb suctioned and the cord was doubly clamped and cut.  The infant was handed off to the awaiting nursing staff.  Cord blood was drawn.  The placenta was expressed with the aid of fundal massage and found to be intact.      The uterus was exteriorized and cleared of all clots and debris with a moist laparotomy pad.  The uterine incision was closed with 0 Chromic in running lock fashion.  Another 0 Chromic suture was used to perform an imbricating stitch.  Some bleeding  was noted from the midline of the incision and this was controlled with several figure of eight and running interlocking sutures of 0 Vicryl.  Below the left apex of the incision there was an area of bleeding that was not controlled with Bovie.  This was controlled with several sutures of 0 Chromic in figure of eight and running interlocking suture.     The posterior cul-de-sac was cleared of fluid and debris with irrigation.  The uterus was replaced into anatomic position within the pelvis and the anterior cul-de-sac cleared of fluid and debris with irrigation. The uterine incision was inspected once more and noted to be hemostatic.  Surgicell powder was applied over the incision for additional hemostasis.  The  fascia was closed with 0 - Vicryl in running fashion with two sutures from each apex meeting in the midline..  The subcuticular layer was irrigated and made hemostatic with Bovie cautery.   The skin was closed with 4-0 Vicryl with Exofin Glue..       Fundal pressure was applied to express blood and clots and noted to be firm.    All instrument, sponge and needle counts were correct at the end of the procedure. The patient tolerated the procedure well. The patient was transferred to Labor and Delivery for recovery in good condition.    Disposition:  Infant admitted to floor (rooming in with mother).  Team Member Updated:.    "Delivery Information"    IO Blood Loss  12/01/20 2252 - 12/02/20 0011    QBL - (OB only) Anesthesia 600 mL    Total  600 mL         [name], [name] [[id number]]    Labor Events    Preterm labor?: No  Antenatal steroids: None  Rupture type: Artificial  Fluid color: Clear  Fluid odor: none  Labor complications: None          Labor Events    Preterm labor?: No  Antenatal steroids: None  Rupture type: Artificial  Fluid color: Clear  Labor complications: None          Delivery Anesthesia    Method: Spinal     Assisted Delivery Details    Forceps attempted?: No  Vacuum extractor attempted?: No     Shoulder Dystocia Maneuvers    Shoulder dystocia present?: No     Lacerations    Episiotomy: None      Delivery Information    Birth date/time: 12/01/2020 2318  Sex: Female  Delivery type: C-Section, Low Transverse  C-Section categorization: Repeat  C-Section priority: Non-Scheduled  Incision type: Low Transverse  Complications: None     Newborn Presentation    Presentation: Vertex     Cord Information    Vessels: 3 Vessels  Complications: None  Delayed cord clamping?: Yes  Cord clamped date/time: 12/01/2020 2319  Cord blood disposition: Lab  Gases sent?: No     Newborn  Apgars    Living status: Living      Skin color:    Heart rate:    Reflex Irrit:    Muscle tone:    Resp. effort:    Total:     1 Min:      5 Min:     10 Min:     15 Min:     20 Min:          Newborn Measurements    Weight: 3770 g     Placenta    Date/time: 12/01/2020 2319  Removal:  Expressed  Appearance: Intact  Disposition: Held for future pathology     Other Delivery Procedures    Procedures: None     Delivery Providers    Delivering Clinician: Virgilio Belling, MD   Provider Role   Minghini, Suzan Slick, RN Registered Nurse   Gwenevere Abbot, RN Baby Nurse                      Dawn Belling, MD

## 2020-05-01 ENCOUNTER — Encounter (HOSPITAL_COMMUNITY): Payer: Self-pay

## 2020-05-01 ENCOUNTER — Emergency Department (EMERGENCY_DEPARTMENT_HOSPITAL): Payer: Self-pay

## 2020-05-01 ENCOUNTER — Emergency Department
Admission: EM | Admit: 2020-05-01 | Discharge: 2020-05-01 | Disposition: A | Discharge status: Home / Self Care | Payer: Self-pay | Attending: Emergency Medicine | Admitting: Emergency Medicine

## 2020-05-01 DIAGNOSIS — N3000 Acute cystitis without hematuria: Secondary | ICD-10-CM | POA: Insufficient documentation

## 2020-05-01 DIAGNOSIS — Z3A01 Less than 8 weeks gestation of pregnancy: Secondary | ICD-10-CM | POA: Insufficient documentation

## 2020-05-01 DIAGNOSIS — Z3689 Encounter for other specified antenatal screening: Secondary | ICD-10-CM

## 2020-05-01 DIAGNOSIS — O26891 Other specified pregnancy related conditions, first trimester: Secondary | ICD-10-CM | POA: Insufficient documentation

## 2020-05-01 LAB — CBC WITH DIFF
BASOPHIL #: <0.1 10*3/uL (ref <=0.20)
BASOPHIL %: 0 %
EOSINOPHIL #: <0.1 10*3/uL (ref <=0.50)
EOSINOPHIL %: 1 %
HCT: 38.7 % (ref 34.8–46.0)
HGB: 12.8 g/dL (ref 11.5–16.0)
IMMATURE GRANULOCYTE #: <0.1 10*3/uL (ref <0.10)
IMMATURE GRANULOCYTE %: 0 % (ref 0–1)
LYMPHOCYTE #: 1.99 10*3/uL (ref 1.00–4.80)
LYMPHOCYTE %: 28 %
MCH: 30.3 pg (ref 26.0–32.0)
MCHC: 33.1 g/dL (ref 31.0–35.5)
MCV: 91.5 fL (ref 78.0–100.0)
MONOCYTE #: 0.59 10*3/uL (ref 0.20–1.10)
MONOCYTE %: 8 %
MPV: 9.6 fL (ref 8.7–12.5)
NEUTROPHIL #: 4.39 10*3/uL (ref 1.50–7.70)
NEUTROPHIL %: 63 %
PLATELETS: 377 10*3/uL (ref 150–400)
RBC: 4.23 10*6/uL (ref 3.85–5.22)
RDW-CV: 12.8 % (ref 11.5–15.5)
WBC: 7.1 10*3/uL (ref 3.7–11.0)

## 2020-05-01 LAB — COMPREHENSIVE METABOLIC PANEL, NON-FASTING
ALBUMIN: 3.8 g/dL (ref 3.5–5.0)
ALKALINE PHOSPHATASE: 61 U/L (ref 40–110)
ALT (SGPT): 14 U/L (ref 8–22)
ANION GAP: 10 mmol/L (ref 4–13)
AST (SGOT): 15 U/L (ref 8–45)
BILIRUBIN TOTAL: 0.3 mg/dL (ref 0.3–1.3)
BUN/CREA RATIO: 19 (ref 6–22)
BUN: 12 mg/dL (ref 8–25)
CALCIUM: 9 mg/dL (ref 8.5–10.0)
CHLORIDE: 105 mmol/L (ref 96–111)
CO2 TOTAL: 22 mmol/L (ref 22–30)
CREATININE: 0.62 mg/dL (ref 0.60–1.05)
ESTIMATED GFR: >90 mL/min/BSA (ref >=60)
GLUCOSE: 86 mg/dL (ref 65–125)
POTASSIUM: 4 mmol/L (ref 3.5–5.1)
PROTEIN TOTAL: 7.6 g/dL (ref 6.4–8.3)
SODIUM: 137 mmol/L (ref 136–145)

## 2020-05-01 LAB — URINALYSIS, MACROSCOPIC
BILIRUBIN: NEGATIVE mg/dL
GLUCOSE: NORMAL mg/dL
KETONES: NEGATIVE mg/dL
NITRITE: NEGATIVE
PH: 7 (ref 5.0–7.5)
PROTEIN: 15 mg/dL — AB
SPECIFIC GRAVITY: 1.015 (ref 1.005–1.020)
UROBILINOGEN: <1 mg/dL (ref <=2.0)

## 2020-05-01 LAB — URINALYSIS, MICROSCOPIC

## 2020-05-01 LAB — WETMOUNT
CLUE CELLS: ABSENT
TRICHOMONAS: ABSENT
YEAST: ABSENT

## 2020-05-01 LAB — HCG, PLASMA OR SERUM QUANTITATIVE, PREGNANCY: HCG QUANTITATIVE PREGNANCY: 106602 m[IU]/mL — ABNORMAL HIGH (ref <5)

## 2020-05-01 MED ORDER — ONDANSETRON HCL (PF) 4 MG/2 ML INJECTION SOLUTION
4.0000 mg | INTRAMUSCULAR | Status: DC
Start: 2020-05-01 — End: 2020-05-01
  Administered 2020-05-01: 16:00:00 0 mg via INTRAVENOUS

## 2020-05-01 MED ORDER — CEPHALEXIN 500 MG CAPSULE
500.0000 mg | ORAL_CAPSULE | ORAL | Status: AC
Start: 2020-05-01 — End: 2020-05-01
  Administered 2020-05-01: 500 mg via ORAL
  Filled 2020-05-01: qty 1

## 2020-05-01 MED ORDER — CEPHALEXIN 500 MG CAPSULE
500.0000 mg | ORAL_CAPSULE | Freq: Two times a day (BID) | ORAL | 0 refills | Status: DC
Start: 2020-05-01 — End: 2020-12-02

## 2020-05-01 MED ORDER — SODIUM CHLORIDE 0.9 % IV BOLUS
1000.0000 mL | INJECTION | Status: AC
Start: 2020-05-01 — End: 2020-05-01
  Administered 2020-05-01: 1000 mL via INTRAVENOUS
  Administered 2020-05-01: 19:00:00 0 mL via INTRAVENOUS

## 2020-05-01 NOTE — ED Triage Notes (Addendum)
Spanish interpretor Gerilyn Nestle [phone] used at this time for triage. Pt reports lower abdominal and vaginal pain. Home pregnancy test positive. Pt reports burning with urination and difficulty stopping flow of urine. Pt unsure of how far along this pregnancy is. Scheduled an appt with OBGYN; February 8th. Pain at this time is 8/10 burning to vagina/urethra.

## 2020-05-01 NOTE — ED Nurses Note (Signed)
Provide urine cup for UA and sent to waiting room; advised to bring to registration desk if pt urinates.

## 2020-05-01 NOTE — ED Provider Notes (Signed)
[facility]  [facility]  Emergency Department     HISTORY OF PRESENT ILLNESS     Date:  05/01/2020  Patient's Name:  [name]  Date of Birth:  09-05-1980    40 year old female with no known medical problems she to T1 at unknown weeks gestation presents to the emergency department complaints of dysuria and pelvic pain.  Patient denies vaginal bleeding.  She reports starting last night she began having pain when she urinated and felt that it was hard to urinate.  She denies back pain, chest pain.  She notes pain in her pelvis on both sides.  Patient has her first appointment with ob/gyn on feb 8th. She denies vaginal discharge. She notes her LMP was early Dec. Denies fever, denies nausea, vomiting or diarrhea but notes dizziness.          Review of Systems     Review of Systems   Constitutional: Negative for chills, fatigue and fever.   HENT: Negative for ear pain and sore throat.    Eyes: Negative for discharge.   Respiratory: Negative for cough, shortness of breath and wheezing.    Cardiovascular: Negative for chest pain and palpitations.   Gastrointestinal: Negative for abdominal distention, abdominal pain, diarrhea, nausea and vomiting.   Genitourinary: Positive for dysuria, frequency and pelvic pain. Negative for flank pain.   Musculoskeletal: Negative for arthralgias, back pain and myalgias.   Skin: Negative for rash and wound.   Neurological: Positive for light-headedness. Negative for dizziness, syncope and headaches.   Psychiatric/Behavioral: Negative for behavioral problems.       Previous History     Past Medical History:  No past medical history on file.    Past Surgical History:  No past surgical history on file.    Social History:     Social History     Substance and Sexual Activity   Drug Use Not on file       Family History:  No family history on file.    Medication History:  Current Outpatient Medications   Medication Sig   . cephalexin (KEFLEX) 500 mg Oral Capsule  Take 1 Capsule (500 mg total) by mouth Twice daily       Allergies:  No Known Allergies    Physical Exam     Vitals:    BP (!) 99/58   Pulse 81   Temp 36.5 C (97.7 F)   Resp 17   Wt 59.9 kg (132 lb)   SpO2 98%           Physical Exam  Vitals and nursing note reviewed.   Constitutional:       General: She is not in acute distress.     Appearance: Normal appearance.   HENT:      Head: Normocephalic and atraumatic.      Mouth/Throat:      Mouth: Mucous membranes are moist.      Pharynx: Oropharynx is clear.   Eyes:      Conjunctiva/sclera: Conjunctivae normal.      Pupils: Pupils are equal, round, and reactive to light.   Cardiovascular:      Rate and Rhythm: Normal rate and regular rhythm.   Pulmonary:      Effort: Pulmonary effort is normal. No respiratory distress.      Breath sounds: Normal breath sounds. No wheezing.   Abdominal:      General: Abdomen is flat. There is no distension.  Palpations: Abdomen is soft.      Tenderness: There is no abdominal tenderness. There is no rebound.      Comments: Mild suprapubic TTP   Genitourinary:     General: Normal vulva.      Vagina: No vaginal discharge.      Comments: No CMT, no vaginal discharge  Musculoskeletal:         General: No swelling. Normal range of motion.      Cervical back: Normal range of motion.   Skin:     General: Skin is warm and dry.      Findings: No rash.   Neurological:      General: No focal deficit present.      Mental Status: She is alert and oriented to person, place, and time.      Motor: No weakness.   Psychiatric:         Mood and Affect: Mood normal.         Behavior: Behavior normal.         Diagnostic Studies/Treatment     Medications:  Medications   ondansetron (ZOFRAN) 2 mg/mL injection (has no administration in time range)   cephalexin (KEFLEX) capsule (has no administration in time range)   NS bolus infusion 1,000 mL (1,000 mL Intravenous New Bag/New Syringe 05/01/20 1648)       New Prescriptions    CEPHALEXIN (KEFLEX) 500 MG  ORAL CAPSULE    Take 1 Capsule (500 mg total) by mouth Twice daily       Labs:    Results for orders placed or performed during the hospital encounter of 05/01/20   URINALYSIS, MACROSCOPIC   Result Value Ref Range    COLOR Yellow Yellow    APPEARANCE Slightly Cloudy (A) Clear    PH 7.0 5.0 - 7.5    LEUKOCYTES 2+ (A) Negative WBCs/uL    NITRITE Negative Negative    PROTEIN 15  (A) Negative mg/dL    GLUCOSE Normal Normal mg/dL    KETONES Negative Negative mg/dL    UROBILINOGEN < 1 <=7.3 mg/dL    BILIRUBIN Negative Negative mg/dL    BLOOD 4+ (A) Negative mg/dL    SPECIFIC GRAVITY 2.202 1.005 - 1.020   URINALYSIS, MICROSCOPIC   Result Value Ref Range    RBCS 6-10 (A) 0-2, None /hpf    WBCS 3-5 0-2, 3-5, None /hpf    BACTERIA 2+ (A) None /hpf    SQUAMOUS EPITHELIAL 11-20 (A) 0-2, None /hpf   COMPREHENSIVE METABOLIC PANEL, NON-FASTING   Result Value Ref Range    SODIUM 137 136 - 145 mmol/L    POTASSIUM 4.0 3.5 - 5.1 mmol/L    CHLORIDE 105 96 - 111 mmol/L    CO2 TOTAL 22 22 - 30 mmol/L    ANION GAP 10 4 - 13 mmol/L    BUN 12 8 - 25 mg/dL    CREATININE 5.42 7.06 - 1.05 mg/dL    BUN/CREA RATIO 19 6 - 22    ESTIMATED GFR >90 >=60 mL/min/BSA    ALBUMIN 3.8 3.5 - 5.0 g/dL     CALCIUM 9.0 8.5 - 23.7 mg/dL    GLUCOSE 86 65 - 628 mg/dL    ALKALINE PHOSPHATASE 61 40 - 110 U/L    ALT (SGPT) 14 8 - 22 U/L    AST (SGOT)  15 8 - 45 U/L    BILIRUBIN TOTAL 0.3 0.3 - 1.3 mg/dL    PROTEIN TOTAL 7.6 6.4 - 8.3  g/dL   HCG, PLASMA OR SERUM QUANTITATIVE, PREGNANCY   Result Value Ref Range    HCG QUANTITATIVE PREGNANCY 106,602 (H) <5 mIU/mL   CBC WITH DIFF   Result Value Ref Range    WBC 7.1 3.7 - 11.0 x10^3/uL    RBC 4.23 3.85 - 5.22 x10^6/uL    HGB 12.8 11.5 - 16.0 g/dL    HCT 22.6 33.3 - 54.5 %    MCV 91.5 78.0 - 100.0 fL    MCH 30.3 26.0 - 32.0 pg    MCHC 33.1 31.0 - 35.5 g/dL    RDW-CV 62.5 63.8 - 93.7 %    PLATELETS 377 150 - 400 x10^3/uL    MPV 9.6 8.7 - 12.5 fL    NEUTROPHIL % 63 %    LYMPHOCYTE % 28 %    MONOCYTE % 8 %    EOSINOPHIL % 1  %    BASOPHIL % 0 %    NEUTROPHIL # 4.39 1.50 - 7.70 x10^3/uL    LYMPHOCYTE # 1.99 1.00 - 4.80 x10^3/uL    MONOCYTE # 0.59 0.20 - 1.10 x10^3/uL    EOSINOPHIL # <0.10 <=0.50 x10^3/uL    BASOPHIL # <0.10 <=0.20 x10^3/uL    IMMATURE GRANULOCYTE % 0 0 - 1 %    IMMATURE GRANULOCYTE # <0.10 <0.10 x10^3/uL       Radiology:  US OB <14 WEEKS SINGLE Neita Carp - 76801/76817    US OB <14 WEEKS SINGLE GESTATION W TRANSVAG - 76801/76817   Final Result   Single intrauterine gestation with crown-rump length equal to 7 weeks, 1 day, and with fetal cardiac activity confirmed.         Radiologist location ID: [id number]             ECG:  NONE      Procedure     Procedures    Course/Disposition/Plan     Course:     40 year old female presents to the emergency department with pelvic pain and dysuria in early pregnancy.  Concern for UTI verses PID. Will check labs, pelvic and [place]. Evaluate for ectopic. Patient with nl labs, viable IUP on [place]. Will start keflex for UTI. Discharge home.    Disposition:    Discharged    Condition at Disposition:   Stable  Follow up:     Please follow up with your OB doctor  Call in 1 day        Clinical Impression:     Encounter Diagnosis   Name Primary?   . Acute cystitis without hematuria Yes       Future Appointments Scheduled in Epic:  No future appointments.

## 2020-05-01 NOTE — ED Nurses Note (Signed)
Patient discharged home.  AVS reviewed with patient.  A written copy of the AVS and discharge instructions was given to the patient.  Questions sufficiently answered as needed.  Patient encouraged to follow up with PCP as indicated.

## 2020-05-02 LAB — CHLAMYDIA TRACHOMATIS/NEISSERIA GONORRHOEAE RNA, NAAT
CHLAMYDIA TRACHOMATIS RNA: NEGATIVE
NEISSERIA GONORRHEA GC RNA: NEGATIVE

## 2020-05-04 LAB — URINE CULTURE,ROUTINE: URINE CULTURE: >100000 — AB

## 2020-05-05 NOTE — Result Encounter Note (Signed)
Your urine culture results have returned in indicate that the bacteria sensitive to the current antibiotic.  Continue treatment plan as directed during visit.    Jackquline Denmark Eathel Pajak, DNP,FNP-C  05/05/2020, 11:20

## 2020-11-20 ENCOUNTER — Other Ambulatory Visit: Payer: Self-pay | Attending: OBSTETRICS-GYNECOLOGY

## 2020-11-20 DIAGNOSIS — O0993 Supervision of high risk pregnancy, unspecified, third trimester: Secondary | ICD-10-CM

## 2020-11-21 LAB — GROUP B STREPTOCOCCUS DNA BY NAAT: GROUP B STREPTOCOCCUS (GBS) DNA BY NAAT: NEGATIVE

## 2020-12-01 ENCOUNTER — Inpatient Hospital Stay (HOSPITAL_COMMUNITY): Payer: MEDICAID | Admitting: Certified Registered"

## 2020-12-01 ENCOUNTER — Encounter (HOSPITAL_COMMUNITY)
Admission: RE | Disposition: A | Discharge status: Home / Self Care | Payer: Self-pay | Source: Home / Self Care | Attending: OBSTETRICS-GYNECOLOGY

## 2020-12-01 ENCOUNTER — Encounter (HOSPITAL_COMMUNITY): Payer: Self-pay

## 2020-12-01 ENCOUNTER — Other Ambulatory Visit: Payer: Self-pay

## 2020-12-01 ENCOUNTER — Inpatient Hospital Stay
Admission: RE | Admit: 2020-12-01 | Discharge: 2020-12-03 | DRG: 787 | Disposition: A | Discharge status: Home / Self Care | Payer: MEDICAID | Attending: OBSTETRICS-GYNECOLOGY | Admitting: OBSTETRICS-GYNECOLOGY

## 2020-12-01 DIAGNOSIS — O403XX0 Polyhydramnios, third trimester, not applicable or unspecified: Secondary | ICD-10-CM | POA: Diagnosis present

## 2020-12-01 DIAGNOSIS — Z20822 Contact with and (suspected) exposure to covid-19: Secondary | ICD-10-CM | POA: Diagnosis present

## 2020-12-01 DIAGNOSIS — O3663X0 Maternal care for excessive fetal growth, third trimester, not applicable or unspecified: Principal | ICD-10-CM | POA: Diagnosis present

## 2020-12-01 DIAGNOSIS — Z8616 Personal history of COVID-19: Secondary | ICD-10-CM | POA: Diagnosis present

## 2020-12-01 DIAGNOSIS — D509 Iron deficiency anemia, unspecified: Secondary | ICD-10-CM | POA: Diagnosis present

## 2020-12-01 DIAGNOSIS — Z98891 History of uterine scar from previous surgery: Secondary | ICD-10-CM | POA: Diagnosis not present

## 2020-12-01 DIAGNOSIS — Z3A38 38 weeks gestation of pregnancy: Secondary | ICD-10-CM

## 2020-12-01 DIAGNOSIS — O9081 Anemia of the puerperium: Secondary | ICD-10-CM | POA: Diagnosis not present

## 2020-12-01 DIAGNOSIS — O34211 Maternal care for low transverse scar from previous cesarean delivery: Secondary | ICD-10-CM | POA: Diagnosis present

## 2020-12-01 DIAGNOSIS — D62 Acute posthemorrhagic anemia: Secondary | ICD-10-CM | POA: Diagnosis not present

## 2020-12-01 DIAGNOSIS — O09523 Supervision of elderly multigravida, third trimester: Secondary | ICD-10-CM | POA: Diagnosis present

## 2020-12-01 DIAGNOSIS — Z789 Other specified health status: Secondary | ICD-10-CM | POA: Diagnosis present

## 2020-12-01 HISTORY — DX: Personal history of COVID-19: Z86.16

## 2020-12-01 LAB — ABO & RH: ABO/RH(D): O POS

## 2020-12-01 LAB — CBC WITH DIFF
BASOPHIL #: <0.1 10*3/uL (ref <=0.20)
BASOPHIL %: 0 %
EOSINOPHIL #: <0.1 10*3/uL (ref <=0.50)
EOSINOPHIL %: 0 %
HCT: 32.7 % — ABNORMAL LOW (ref 34.8–46.0)
HGB: 10 g/dL — ABNORMAL LOW (ref 11.5–16.0)
IMMATURE GRANULOCYTE #: <0.1 10*3/uL (ref <0.10)
IMMATURE GRANULOCYTE %: 1 % (ref 0–1)
LYMPHOCYTE #: 1.72 10*3/uL (ref 1.00–4.80)
LYMPHOCYTE %: 21 %
MCH: 26.9 pg (ref 26.0–32.0)
MCHC: 30.6 g/dL — ABNORMAL LOW (ref 31.0–35.5)
MCV: 87.9 fL (ref 78.0–100.0)
MONOCYTE #: 0.72 10*3/uL (ref 0.20–1.10)
MONOCYTE %: 9 %
MPV: 10.4 fL (ref 8.7–12.5)
NEUTROPHIL #: 5.6 10*3/uL (ref 1.50–7.70)
NEUTROPHIL %: 69 %
PLATELETS: 219 10*3/uL (ref 150–400)
RBC: 3.72 10*6/uL — ABNORMAL LOW (ref 3.85–5.22)
RDW-CV: 14.6 % (ref 11.5–15.5)
WBC: 8.1 10*3/uL (ref 3.7–11.0)

## 2020-12-01 LAB — TYPE AND SCREEN
ABO/RH(D): O POS
ANTIBODY SCREEN: NEGATIVE

## 2020-12-01 LAB — COVID-19 ~~LOC~~ MOLECULAR LAB TESTING: SARS-CoV-2: NOT DETECTED

## 2020-12-01 SURGERY — Surgical Case
Anesthesia: Spinal | Site: Abdomen | Wound class: Clean Contaminated Wounds-The respiratory, GI, Genital, or urinary

## 2020-12-01 MED ORDER — CALCIUM 200 MG (AS CALCIUM CARBONATE 500 MG) CHEWABLE TABLET
500.0000 mg | CHEWABLE_TABLET | Freq: Three times a day (TID) | ORAL | Status: DC | PRN
Start: 2020-12-01 — End: 2020-12-02

## 2020-12-01 MED ORDER — OXYCODONE 5 MG TABLET
10.0000 mg | ORAL_TABLET | ORAL | Status: DC | PRN
Start: 2020-12-01 — End: 2020-12-02

## 2020-12-01 MED ORDER — LIDOCAINE HCL 10 MG/ML (1 %) INJECTION SOLUTION
10.0000 mL | Freq: Once | INTRAMUSCULAR | Status: DC | PRN
Start: 2020-12-01 — End: 2020-12-02

## 2020-12-01 MED ORDER — FENTANYL (PF) 50 MCG/ML INJECTION SOLUTION
50.0000 ug | INTRAMUSCULAR | Status: DC | PRN
Start: 2020-12-01 — End: 2020-12-02

## 2020-12-01 MED ORDER — MISOPROSTOL 200 MCG TABLET
1000.0000 ug | ORAL_TABLET | Freq: Once | ORAL | Status: DC | PRN
Start: 2020-12-01 — End: 2020-12-02

## 2020-12-01 MED ORDER — ACETAMINOPHEN 1,000 MG/100 ML (10 MG/ML) INTRAVENOUS SOLUTION
1000.0000 mg | Freq: Four times a day (QID) | INTRAVENOUS | Status: AC | PRN
Start: 2020-12-01 — End: 2020-12-02
  Administered 2020-12-02: 0 mg via INTRAVENOUS
  Administered 2020-12-02 (×2): 1000 mg via INTRAVENOUS
  Administered 2020-12-02: 0 mg via INTRAVENOUS
  Filled 2020-12-01 (×2): qty 100

## 2020-12-01 MED ORDER — LACTATED RINGERS INTRAVENOUS SOLUTION
INTRAVENOUS | Status: DC
Start: 2020-12-02 — End: 2020-12-02
  Administered 2020-12-01: 0 via INTRAVENOUS

## 2020-12-01 MED ORDER — SODIUM CHLORIDE 0.9 % (FLUSH) INJECTION SYRINGE
10.0000 mL | INJECTION | INTRAMUSCULAR | Status: DC | PRN
Start: 2020-12-01 — End: 2020-12-02

## 2020-12-01 MED ORDER — SODIUM CHLORIDE 0.9% FLUSH BAG - 250 ML
INTRAVENOUS | Status: DC | PRN
Start: 2020-12-01 — End: 2020-12-02

## 2020-12-01 MED ORDER — OXYCODONE 5 MG TABLET
5.0000 mg | ORAL_TABLET | ORAL | Status: DC | PRN
Start: 2020-12-01 — End: 2020-12-02

## 2020-12-01 MED ORDER — KETOROLAC 30 MG/ML (1 ML) INJECTION SOLUTION
Freq: Once | INTRAMUSCULAR | Status: DC | PRN
Start: 2020-12-01 — End: 2020-12-02
  Administered 2020-12-01: 30 mg via INTRAVENOUS

## 2020-12-01 MED ORDER — NALOXONE 0.4 MG/ML INJECTION SOLUTION
0.4000 mg | Freq: Once | INTRAMUSCULAR | Status: DC | PRN
Start: 2020-12-01 — End: 2020-12-02

## 2020-12-01 MED ORDER — ONDANSETRON HCL (PF) 4 MG/2 ML INJECTION SOLUTION
Freq: Once | INTRAMUSCULAR | Status: DC | PRN
Start: 2020-12-01 — End: 2020-12-02
  Administered 2020-12-01: 4 mg via INTRAVENOUS

## 2020-12-01 MED ORDER — OXYTOCIN 10 UNIT/ML INJECTION SOLUTION
10.0000 [IU] | Freq: Once | INTRAMUSCULAR | Status: DC | PRN
Start: 2020-12-01 — End: 2020-12-02

## 2020-12-01 MED ORDER — CEFAZOLIN 2 GRAM/50 ML IN DEXTROSE (ISO-OSMOTIC) INTRAVENOUS PIGGYBACK
2.0000 g | INJECTION | INTRAVENOUS | Status: AC
Start: 2020-12-01 — End: 2020-12-01
  Administered 2020-12-01: 2 g via INTRAVENOUS
  Filled 2020-12-01: qty 50

## 2020-12-01 MED ORDER — NALBUPHINE 10 MG/ML INJECTION SOLUTION
10.0000 mg | INTRAMUSCULAR | Status: DC | PRN
Start: 2020-12-01 — End: 2020-12-02

## 2020-12-01 MED ORDER — MINERAL OIL ORAL
30.0000 mL | TOPICAL_OIL | ORAL | Status: DC | PRN
Start: 2020-12-01 — End: 2020-12-02

## 2020-12-01 MED ORDER — OXYTOCIN 30 UNITS IN NS 500 ML - FOR ANES
INTRAVENOUS | Status: DC | PRN
Start: 2020-12-01 — End: 2020-12-02

## 2020-12-01 MED ORDER — ONDANSETRON HCL (PF) 4 MG/2 ML INJECTION SOLUTION
4.0000 mg | Freq: Three times a day (TID) | INTRAMUSCULAR | Status: DC | PRN
Start: 2020-12-01 — End: 2020-12-02

## 2020-12-01 MED ORDER — MORPHINE (PF) 1 MG/ML INJECTION SOLUTION
Freq: Once | INTRAMUSCULAR | Status: DC | PRN
Start: 2020-12-01 — End: 2020-12-02
  Administered 2020-12-01: .15 mg via INTRATHECAL

## 2020-12-01 MED ORDER — METHYLERGONOVINE 0.2 MG/ML (1 ML) INJECTION SOLUTION
0.2000 mg | Freq: Once | INTRAMUSCULAR | Status: AC | PRN
Start: 2020-12-01 — End: 2020-12-01
  Administered 2020-12-01 (×2): .2 mg via INTRAMUSCULAR

## 2020-12-01 MED ORDER — SODIUM CHLORIDE 0.9 % INTRAVENOUS SOLUTION
500.0000 mg | INTRAVENOUS | Status: AC
Start: 2020-12-01 — End: 2020-12-01
  Administered 2020-12-01 (×2): 500 mg via INTRAVENOUS
  Filled 2020-12-01: qty 5

## 2020-12-01 MED ORDER — MORPHINE (PF) 1 MG/ML INJECTION SOLUTION
INTRAMUSCULAR | Status: AC
Start: 2020-12-01 — End: 2020-12-01
  Filled 2020-12-01: qty 10

## 2020-12-01 MED ORDER — OXYTOCIN 30 UNIT/500 ML IN 0.9 % SODIUM CHLORIDE INTRAVENOUS
Freq: Once | INTRAVENOUS | Status: DC
Start: 2020-12-02 — End: 2020-12-02
  Filled 2020-12-01: qty 500

## 2020-12-01 MED ORDER — MORPHINE 4 MG/ML INTRAVENOUS SOLUTION
2.0000 mg | INTRAVENOUS | Status: DC | PRN
Start: 2020-12-01 — End: 2020-12-02

## 2020-12-01 MED ORDER — BUPIVACAINE (PF) 0.75 % (7.5 MG/ML) IN 8.25 % DEXTROSE INJECTION
Freq: Once | INTRAMUSCULAR | Status: DC | PRN
Start: 2020-12-01 — End: 2020-12-02
  Administered 2020-12-01 (×2): 1.2 mL via INTRATHECAL

## 2020-12-01 MED ORDER — PHENYLEPHRINE 1 MG/10 ML (100 MCG/ML) IN 0.9 % SODIUM CHLORIDE IV
Freq: Once | INTRAVENOUS | Status: DC | PRN
Start: 2020-12-01 — End: 2020-12-02
  Administered 2020-12-01 (×2): 50 ug via INTRAVENOUS
  Administered 2020-12-01: 100 ug via INTRAVENOUS
  Administered 2020-12-01: 50 ug via INTRAVENOUS
  Administered 2020-12-01: 100 ug via INTRAVENOUS

## 2020-12-01 MED ORDER — FAMOTIDINE (PF) 20 MG/2 ML INTRAVENOUS SOLUTION
20.0000 mg | Freq: Two times a day (BID) | INTRAVENOUS | Status: DC
Start: 2020-12-02 — End: 2020-12-03
  Administered 2020-12-02: 20 mg via INTRAVENOUS
  Administered 2020-12-03 (×2): 0 mg via INTRAVENOUS
  Filled 2020-12-01 (×2): qty 2

## 2020-12-01 MED ORDER — TRANEXAMIC ACID 1,000 MG/100 ML(10 MG/ML)IN SOD CHLOR,ISO IV PIGGYBACK
1000.0000 mg | INJECTION | Freq: Once | INTRAVENOUS | Status: DC | PRN
Start: 2020-12-01 — End: 2020-12-02

## 2020-12-01 MED ORDER — CARBOPROST TROMETHAMINE 250 MCG/ML INTRAMUSCULAR SOLUTION
250.0000 ug | Freq: Once | INTRAMUSCULAR | Status: DC | PRN
Start: 2020-12-01 — End: 2020-12-02

## 2020-12-01 SURGICAL SUPPLY — 25 items
APPL 70% ISPRP 2% CHG 26ML 13._2X13.2IN CHLRPRP PREP DEHP-FR (MED SURG SUPPLIES) ×2
APPL 70% ISPRP 2% CHG 26ML CHLRPRP HI-LT ORNG PREP STRL LF  DISP CLR (MED SURG SUPPLIES) ×2 IMPLANT
CLEANER ESURG TIP LCTRBRS PNCL HNDSWH STRL DISP (SURGICAL CUTTING SUPPLIES) ×1 IMPLANT
CONV USE ITEM 337920 - PACK SURG CSECT STRL DISP LF (CUSTOM TRAYS & PACK) ×1 IMPLANT
DRAPE TWL SURG (DRAPE/PACKS/SHEETS/OR TOWEL) ×1 IMPLANT
ELECTRODE ESURG BLADE PNCL 10FT VLAB STRL SS DISP RCKR SWH HEX LOCK CORD HLSTR LF  ACPT 3/32IN STD (SURGICAL CUTTING SUPPLIES) ×1 IMPLANT
ELECTRODE ESURG BLADE PNCL 10F_T 3/32IN VLAB STRL SS DISP (CUTTING ELEMENTS) ×1
GOWN SURG XL AAMI L4 IMPRV REI NF BRTHBL STRL LF DISP CNVRT (PROTECTIVE PRODUCTS/GARMENTS) ×1 IMPLANT
NEG PRESS PREVENA + VIS ADBL ALRM REPL CAN RCHRG BTRY 0.019% IONIC SILVER -125 MMHG SYSTEM 150ML (WOUND CARE SUPPLY) IMPLANT
NEG PRESS PREVENA PEEL & PLC 20CM DRESS INDICATOR CAN 125 MMHG SYSTEM 45ML DISP (WOUND CARE SUPPLY) IMPLANT
NEG PRESS PREVENA PEEL & PLC PTCH STRP VAC 13CM THERAPY UNIT CONN CAN CRRY CA SYSTEM 45ML (WOUND CARE SUPPLY) IMPLANT
PACK SURG CSECT STRL DISP LF (CUSTOM TRAYS & PACK) ×1
PAD EG 9FT 15SQ IN UNIV SPLT CORD CNDCT AREA SAF RING LF (SURGICAL CUTTING SUPPLIES) ×1 IMPLANT
RETRACTR TRAXI ABDOMINAL SURG PANNICULUS (MED SURG SUPPLIES) IMPLANT
SOL IRRG 0.9% NACL 1000ML PLASTIC PR BTL ISTNC N-PYRG STRL LF (MEDICATIONS/SOLUTIONS) ×1 IMPLANT
SOL IRRG 0.9% NACL 500ML PLASTIC PR BTL ISTNC N-PYRG STRL LF (MEDICATIONS/SOLUTIONS) IMPLANT
SPONGE LAP 18 X 18IN STRL_AL1818 5EA/PK 40PK/CS (MED SURG SUPPLIES) ×2
SPONGE LAP 18X18IN STRL (MED SURG SUPPLIES) ×2 IMPLANT
SUTURE 0 TP-1 PDS2 60IN VIOL MONOF LOOP ABS (SUTURE/WOUND CLOSURE) ×2 IMPLANT
SUTURE 0 TP-1 PDS2 60IN VIOL M_ONOF LOOP ABS (SUTURE/WOUND CLOSURE) ×2
SUTURE PLAIN 2-0 CT1 27IN TAN MONOF ABS (SUTURE/WOUND CLOSURE) IMPLANT
SUTURE PLN GUT 2-0 CT1 27IN TA_N MONOF ABS (SUTURE/WOUND CLOSURE)
SYRINGE LL 10ML LF  STRL GRAD N-PYRG DEHP-FR PVC FREE MED DISP (MED SURG SUPPLIES) ×1 IMPLANT
TIP SUCT MEDIVAC YANKAUER TAPER BLBS CLR STRL LF  DISP (MED SURG SUPPLIES) ×2 IMPLANT
TRAY CATH 16FR LUBRISIL FOLEY_ADV DRAIN BAG ANTIREFLUX (UROLOGICAL SUPPLIES) IMPLANT

## 2020-12-01 NOTE — Anesthesia Preprocedure Evaluation (Signed)
ANESTHESIA PRE-OP EVALUATION  Planned Procedure: CESAREAN SECTION (N/A Abdomen)  Review of Systems     anesthesia history negative               Pulmonary  negative pulmonary ROS,    Cardiovascular  negative cardio ROS,          GI/Hepatic/Renal   negative GI/hepatic/renal ROS,         Endo/Other   neg endo/other ROS,       Neuro/Psych/MS   negative neuro/psych ROS,      Cancer    negative hematology/oncology ROS,                   Physical Assessment      Airway       Mallampati: II    TM distance: >3 FB    Neck ROM: full  Mouth Opening: fair.            Dental           (+) chipped           Pulmonary    Breath sounds clear to auscultation       Cardiovascular    Rhythm: regular  Rate: Normal       Other findings            Plan  ASA 2     Planned anesthesia type: spinal                         Anesthesia issues/risks discussed are: Spinal Headache, Failure of Block, Nerve Injuries, Sore Throat, Blood Loss and Aspiration.  Anesthetic plan and risks discussed with patient  Signed consent obtained        Use of blood products discussed with patient who consented to blood products.       NPO Status: Full stomach precautions.         (NPO Solids @ 1900)

## 2020-12-01 NOTE — Care Plan (Signed)
Problem: Bleeding (Cesarean Delivery)  Goal: Bleeding is Controlled  Outcome: Ongoing (see interventions/notes)     Problem: Change in Fetal Wellbeing (Cesarean Delivery)  Goal: Stable Fetal Wellbeing  Outcome: Ongoing (see interventions/notes)     Problem: Infection (Cesarean Delivery)  Goal: Absence of Infection Signs and Symptoms  Outcome: Ongoing (see interventions/notes)     Problem: Respiratory Compromise (Cesarean Delivery)  Goal: Effective Oxygenation and Ventilation  Outcome: Ongoing (see interventions/notes)

## 2020-12-01 NOTE — H&P (Signed)
[facility]  History and Physical - Labor Admission      [place], Espinoza, 40 y.o. female  Encounter Start Date:  12/01/2020  Inpatient Admission Date: 12/01/2020  Date of Service: 12/01/2020  Date of Birth:  01/27/1981    Subjective:  No LMP recorded. Patient is pregnant.   Estimated Date of Delivery: 9/6/[redacted]     Weeks gestation:  [redacted]w[redacted]d  Reason for Admission:  onset of labor    Lab Results   Component Value Date    HGB 12.8 05/01/2020    HCT 38.7 05/01/2020     O pos/RI/RPRNR/HbSAg neg/HIV neg/HCV antibody neg/GC Chlamydia neg    HPI:  40 yo G2P1001 @38 .4wks with h/o prior CSX1 for arrest of descent presents in active labor noted to be 8cm on arrival.  Pt reports that CTXs started around 7PM and are q2 min.  She thinks she might had had a small amount of fluid leakage around 7PM but nothing since.  Pregnancy complicated by CSX1, h/o ICP, AMA followed with MFM low risk cfDNA, LGA with last EFW 7/19- 2904g >99%, Polyhydramnios resolved on last MFM scan AFI 21cm and Spanish speaking.    OB History   Gravida Para Term Preterm AB Living   2 1 1     1    SAB IAB Ectopic Multiple Live Births                  # Outcome Date GA Lbr Len/2nd Weight Sex Delivery Anes PTL Lv   2 Current            1 Term                Past Medical History:   Diagnosis Date   . History of 2019 novel coronavirus disease (COVID-19) 12/01/2020         Past Surgical History:   Procedure Laterality Date   . HX CESAREAN SECTION           Medications Prior to Admission     Prescriptions    acetaminophen (TYLENOL) 325 mg Oral Tablet    Take 325 mg by mouth Every 4 hours as needed for Pain    cephalexin (KEFLEX) 500 mg Oral Capsule    Take 1 Capsule (500 mg total) by mouth Twice daily        acetaminophen (OFIRMEV) 10 mg/mL IV 1,000 mg, 1,000 mg, Intravenous, Q6H PRN  azithromycin (ZITHROMAX) 500 mg in NS 250 mL IVPB, 500 mg, Intravenous, Now  calcium carbonate (TUMS) 500mg  (200mg  elemental calcium) chewable tablet, 500 mg, Oral,  3x/day PRN  carboprost tromethamine (HEMABATE) 250 mcg/mL injection, 250 mcg, IntraMUSCULAR, Once PRN  ceFAZolin (ANCEF) 2 g in iso-osmotic 50 mL duplex IVPB, 2 g, Intravenous, Now  [START ON 12/02/2020] famotidine (PEPCID) 10 mg/mL injection, 20 mg, Intravenous, Q12H  fentaNYL (SUBLIMAZE) 50 mcg/mL injection, 50 mcg, Intravenous, Q1H PRN  lidocaine 1% injection, 10 mL, Intradermal, Once PRN  [START ON 12/02/2020] LR premix infusion, , Intravenous, Continuous  methylergonovine (METHERGINE) 0.2 mg/mL injection, 0.2 mg, IntraMUSCULAR, Once PRN  mineral oil topical liquid, 30 mL, Topical, Q10 Min PRN  miSOPROStol (CYTOTEC) tablet, 1,000 mcg, Rectal, Once PRN  morphine (PF) 1 mg/mL injection ---Cabinet Override, , ,   morphine 4 mg/mL injection, 2 mg, Intravenous, Q2H PRN  NS 250 mL flush bag, , Intravenous, Q1H PRN  NS flush syringe, 10 mL, Intravenous, Q1H PRN  ondansetron (ZOFRAN) 2 mg/mL injection, 4 mg, Intravenous, Q8H PRN  oxytocin (PITOCIN) 10 units/mL injection, 10 Units, IntraMUSCULAR, Once PRN  [START ON 12/02/2020] oxytocin (PITOCIN) 30 units in NS 500 mL infusion, , Intravenous, Once  tranexamic acid (CYKLOKAPRON) 1000 mg in NS 100 mL premix IVPB, 1,000 mg, Intravenous, Once PRN      No Known Allergies  Social History     Tobacco Use   . Smoking status: Never Smoker   . Smokeless tobacco: Never Used   Substance Use Topics   . Alcohol use: Never   . Drug use: Never     Social History     Substance and Sexual Activity   Drug Use Never       E-Cigarettes/Vaping       Height: 149.9 cm (4\' 11" )     Weight: 68.5 kg (151 lb)  BMI (Calculated): 30.56       Other than ROS in the HPI, all other systems were negative.    Labs:  pending    Objective:     General: appears in good health and mild distress  Extremities: No cyanosis or edema  Skin: Skin warm and dry  Neurologic: Grossly normal  Psychiatric: Normal affect, behavior, memory, thought content, judgement, and speech.    Fundal height:  S>D    Cervical  Exam:      Dilation: 8cm   Effacement: 100%    Station:             0     Position:  Middle   Consistency:      Soft      Fetal Data:     Multiple Birth: No    Presentation: Vertex     FHR Monitoring Category: Category I    Fetal heart rate tracing:      Heart Rate: 125  Accelerations: Present  Decelerations: None  Variability: Moderate    Estimated Fetal Weight: 7.5lbs    Contractions:  q24min    Assessment:   Active Hospital Problems   (*Primary Problem)    Diagnosis   . History of cesarean section   . Labor abnormal   . Multigravida of advanced maternal age in third trimester   . History of 2019 novel coronavirus disease (COVID-19)     Jan 2022     . Excessive fetal growth affecting management of pregnancy in third trimester   . Polyhydramnios in third trimester   . Uses Spanish as primary spoken language   . Onset (spontaneous) of labor after 37 completed weeks of gestation but before 39 completed weeks gestation, with delivery by (planned) cesarean section       Risk of hemorrhage:  Medium      Plan:   40 yo G2P1001 @38 .4wks presents in active labor, prior CSX1  Desires RCS declines TOLAC, PCS for arrest of descent.  Fetus LGA  Discussed risk of RCS including blood loss, infection, injury to bowel/bladder/ureters/vasculature/nerves, VTE, anesthesia complications, rare risk of hysterectomy due to uncontrolled hemorrhage, possible need for c-section in subsequent pregnancies, and rare risk of death.  Consents signed  Ancef, Azithromycin ordered  Will proceed to OR  20, MD

## 2020-12-01 NOTE — Anesthesia Procedure Notes (Signed)
Neuraxial Block    Performed by:   Performing Provider: Manuela Neptune, CRNA   Authorizing provider: Manuela Neptune, CRNA    Sedation        Blocks   Block: spinal   Type of block: single shot   Indication:primary anesthetic       Requesting surgeon: Virgilio Belling, MD  Technique:  Pt location: In OR  Approach: midline        Preprocedure hand washing was performed sterile field maintained   Needle level: L3-4     Preanesthesia Checklist:  Pre anesthesia checklist: site marked, surgical consent, monitors and equipment checked, timeout performed, anesthesia consent, emergency drugs available and Pateint positioned  Position:  Positioning: sitting   Skin Local:  Skin local: Lidocaine 1%   Spinal Needle:  Spinal needle:Whitacre    Spinal Needle gauge: 25 G    Spinal needle length: 3.5 inch Spinal needle attempts: 1.  Epidural Needle:                Epidural Catheter:              Block Events:     Test dose:                Medications    Dosing          NOTES  Block free text:Lot: [id number]  Exp: 2022-03-06

## 2020-12-01 NOTE — Respiratory Therapy (Signed)
12/01/20 2311   Arrest/Emergency Airway   Start Time 2311   Emergent Call/Page Responded to Emergent Call/Page  (Called to attend Stat C-section, equipment checked, baby cried & no RT assistance was needed)   Stop Time 2323   Duration 12 minutes

## 2020-12-02 ENCOUNTER — Encounter (HOSPITAL_COMMUNITY): Payer: Self-pay | Admitting: OBSTETRICS-GYNECOLOGY

## 2020-12-02 MED ORDER — KETOROLAC 30 MG/ML (1 ML) INJECTION SOLUTION
30.0000 mg | Freq: Four times a day (QID) | INTRAMUSCULAR | Status: AC
Start: 2020-12-02 — End: 2020-12-03
  Administered 2020-12-02 (×3): 30 mg via INTRAVENOUS
  Administered 2020-12-03: 0 mg via INTRAVENOUS
  Filled 2020-12-02 (×4): qty 1

## 2020-12-02 MED ORDER — MORPHINE 4 MG/ML INTRAVENOUS SOLUTION
2.0000 mg | INTRAVENOUS | Status: DC | PRN
Start: 2020-12-02 — End: 2020-12-03

## 2020-12-02 MED ORDER — SIMETHICONE 80 MG CHEWABLE TABLET
80.0000 mg | CHEWABLE_TABLET | ORAL | Status: DC | PRN
Start: 2020-12-02 — End: 2020-12-03

## 2020-12-02 MED ORDER — MAGNESIUM SULFATE (BULK) 100 % CRYSTALS
CRYSTALS | Freq: Two times a day (BID) | Status: DC | PRN
Start: 2020-12-02 — End: 2020-12-03

## 2020-12-02 MED ORDER — OXYCODONE 5 MG TABLET
10.0000 mg | ORAL_TABLET | ORAL | Status: DC | PRN
Start: 2020-12-02 — End: 2020-12-03
  Administered 2020-12-02 – 2020-12-03 (×2): 10 mg via ORAL
  Filled 2020-12-02 (×2): qty 2

## 2020-12-02 MED ORDER — BISACODYL 10 MG RECTAL SUPPOSITORY
10.0000 mg | Freq: Once | RECTAL | Status: DC | PRN
Start: 2020-12-03 — End: 2020-12-03

## 2020-12-02 MED ORDER — SENNOSIDES 8.6 MG TABLET
8.6000 mg | ORAL_TABLET | Freq: Every day | ORAL | Status: DC
Start: 2020-12-02 — End: 2020-12-03
  Administered 2020-12-02 – 2020-12-03 (×2): 8.6 mg via ORAL
  Filled 2020-12-02 (×2): qty 1

## 2020-12-02 MED ORDER — IBUPROFEN 800 MG TABLET
800.0000 mg | ORAL_TABLET | Freq: Four times a day (QID) | ORAL | Status: DC | PRN
Start: 2020-12-03 — End: 2020-12-03
  Administered 2020-12-03: 800 mg via ORAL
  Filled 2020-12-02: qty 1

## 2020-12-02 MED ORDER — LACTATED RINGERS IV BOLUS
1000.0000 mL | INJECTION | Freq: Once | Status: AC
Start: 2020-12-02 — End: 2020-12-02
  Administered 2020-12-02: 1000 mL via INTRAVENOUS
  Administered 2020-12-02: 0 mL via INTRAVENOUS

## 2020-12-02 MED ORDER — FERROUS SULFATE 325 MG (65 MG IRON) TABLET
325.0000 mg | ORAL_TABLET | ORAL | Status: DC
Start: 2020-12-03 — End: 2020-12-03
  Administered 2020-12-03: 325 mg via ORAL
  Filled 2020-12-02: qty 1

## 2020-12-02 MED ORDER — LACTATED RINGERS INTRAVENOUS SOLUTION
INTRAVENOUS | Status: DC
Start: 2020-12-02 — End: 2020-12-03

## 2020-12-02 MED ORDER — OXYCODONE 5 MG TABLET
5.0000 mg | ORAL_TABLET | ORAL | Status: DC | PRN
Start: 2020-12-02 — End: 2020-12-03
  Administered 2020-12-02 – 2020-12-03 (×3): 5 mg via ORAL
  Filled 2020-12-02 (×3): qty 1

## 2020-12-02 MED ORDER — DIPHENHYDRAMINE 50 MG/ML INJECTION SOLUTION
12.5000 mg | Freq: Four times a day (QID) | INTRAMUSCULAR | Status: DC | PRN
Start: 2020-12-02 — End: 2020-12-03
  Administered 2020-12-02: 12.5 mg via INTRAVENOUS
  Filled 2020-12-02: qty 1

## 2020-12-02 NOTE — Care Plan (Signed)
Problem: Adult Inpatient Plan of Care  Goal: Plan of Care Review  Outcome: Ongoing (see interventions/notes)  Goal: Patient-Specific Goal (Individualized)  Outcome: Ongoing (see interventions/notes)  Goal: Absence of Hospital-Acquired Illness or Injury  Outcome: Ongoing (see interventions/notes)  Intervention: Identify and Manage Fall Risk  Recent Flowsheet Documentation  Taken 12/02/2020 0300 by Darrel Hoover, RN  Safety Promotion/Fall Prevention:   nonskid shoes/slippers when out of bed   safety round/check completed  Intervention: Prevent and Manage VTE (Venous Thromboembolism) Risk  Recent Flowsheet Documentation  Taken 12/02/2020 0300 by Darrel Hoover, RN  VTE Prevention/Management: sequential compression devices on  Intervention: Prevent Infection  Recent Flowsheet Documentation  Taken 12/02/2020 0300 by Darrel Hoover, RN  Infection Prevention:   barrier precautions utilized   promote handwashing   rest/sleep promoted  Goal: Optimal Comfort and Wellbeing  Outcome: Ongoing (see interventions/notes)  Intervention: Provide Person-Centered Care  Recent Flowsheet Documentation  Taken 12/02/2020 0300 by Darrel Hoover, RN  Trust Relationship/Rapport:   care explained   choices provided   questions answered   questions encouraged   reassurance provided   thoughts/feelings acknowledged  Goal: Rounds/Family Conference  Outcome: Ongoing (see interventions/notes)     Problem: Bleeding (Cesarean Delivery)  Goal: Bleeding is Controlled  Outcome: Ongoing (see interventions/notes)     Problem: Change in Fetal Wellbeing (Cesarean Delivery)  Goal: Stable Fetal Wellbeing  Outcome: Ongoing (see interventions/notes)     Problem: Infection (Cesarean Delivery)  Goal: Absence of Infection Signs and Symptoms  Outcome: Ongoing (see interventions/notes)  Intervention: Minimize Infection Risk  Recent Flowsheet Documentation  Taken 12/02/2020 0300 by Darrel Hoover, RN  Infection Prevention:   barrier precautions utilized   promote  handwashing   rest/sleep promoted     Problem: Respiratory Compromise (Cesarean Delivery)  Goal: Effective Oxygenation and Ventilation  Outcome: Ongoing (see interventions/notes)     Problem: Adjustment to Role Transition (Postpartum Cesarean Delivery)  Goal: Successful Maternal Role Transition  Outcome: Ongoing (see interventions/notes)     Problem: Bleeding (Postpartum Cesarean Delivery)  Goal: Hemostasis  Outcome: Ongoing (see interventions/notes)     Problem: Infection (Postpartum Cesarean Delivery)  Goal: Absence of Infection Signs and Symptoms  Outcome: Ongoing (see interventions/notes)     Problem: Pain (Postpartum Cesarean Delivery)  Goal: Acceptable Pain Control  Outcome: Ongoing (see interventions/notes)     Problem: Postoperative Nausea and Vomiting (Postpartum Cesarean Delivery)  Goal: Nausea and Vomiting Relief  Outcome: Ongoing (see interventions/notes)     Problem: Postoperative Urinary Retention (Postpartum Cesarean Delivery)  Goal: Effective Urinary Elimination  Outcome: Ongoing (see interventions/notes)

## 2020-12-02 NOTE — Care Plan (Signed)
Problem: Adult Inpatient Plan of Care  Goal: Plan of Care Review  Outcome: Ongoing (see interventions/notes)  Goal: Patient-Specific Goal (Individualized)  Outcome: Ongoing (see interventions/notes)  Goal: Absence of Hospital-Acquired Illness or Injury  Outcome: Ongoing (see interventions/notes)  Intervention: Identify and Manage Fall Risk  Recent Flowsheet Documentation  Taken 12/02/2020 0830 by Katherine Basset, RN  Safety Promotion/Fall Prevention:   activity supervised   fall prevention program maintained   nonskid shoes/slippers when out of bed   safety round/check completed  Intervention: Prevent Skin Injury  Recent Flowsheet Documentation  Taken 12/02/2020 0830 by Katherine Basset, RN  Body Position: supine, head elevated  Intervention: Prevent and Manage VTE (Venous Thromboembolism) Risk  Recent Flowsheet Documentation  Taken 12/02/2020 0830 by Katherine Basset, RN  VTE Prevention/Management:   ambulation promoted   dorsiflexion/plantar flexion performed   sequential compression devices on  Intervention: Prevent Infection  Recent Flowsheet Documentation  Taken 12/02/2020 0830 by Katherine Basset, RN  Infection Prevention:   barrier precautions utilized   cohorting utilized   rest/sleep promoted   personal protective equipment utilized   promote handwashing   visitors restricted/screened   single patient room provided  Goal: Optimal Comfort and Wellbeing  Outcome: Ongoing (see interventions/notes)  Intervention: Provide Person-Centered Care  Recent Flowsheet Documentation  Taken 12/02/2020 0830 by Katherine Basset, RN  Trust Relationship/Rapport:   care explained   choices provided   empathic listening provided   emotional support provided   questions answered   questions encouraged   reassurance provided   thoughts/feelings acknowledged  Goal: Rounds/Family Conference  Outcome: Ongoing (see interventions/notes)     Problem: Bleeding (Cesarean Delivery)  Goal: Bleeding is Controlled  Outcome: Outcome Achieved     Problem: Change in  Fetal Wellbeing (Cesarean Delivery)  Goal: Stable Fetal Wellbeing  Outcome: Outcome Achieved  Intervention: Promote and Monitor Fetal Wellbeing  Recent Flowsheet Documentation  Taken 12/02/2020 0830 by Katherine Basset, RN  Body Position: supine, head elevated     Problem: Respiratory Compromise (Cesarean Delivery)  Goal: Effective Oxygenation and Ventilation  Outcome: Outcome Achieved     Problem: Adjustment to Role Transition (Postpartum Cesarean Delivery)  Goal: Successful Maternal Role Transition  Outcome: Ongoing (see interventions/notes)     Problem: Bleeding (Postpartum Cesarean Delivery)  Goal: Hemostasis  Outcome: Ongoing (see interventions/notes)     Problem: Infection (Postpartum Cesarean Delivery)  Goal: Absence of Infection Signs and Symptoms  Outcome: Ongoing (see interventions/notes)  Intervention: Prevent or Manage Infection  Recent Flowsheet Documentation  Taken 12/02/2020 0830 by Katherine Basset, RN  Fever Reduction/Comfort Measures:   lightweight bedding   lightweight clothing     Problem: Pain (Postpartum Cesarean Delivery)  Goal: Acceptable Pain Control  Outcome: Ongoing (see interventions/notes)     Problem: Postoperative Nausea and Vomiting (Postpartum Cesarean Delivery)  Goal: Nausea and Vomiting Relief  Outcome: Ongoing (see interventions/notes)     Problem: Postoperative Urinary Retention (Postpartum Cesarean Delivery)  Goal: Effective Urinary Elimination  Outcome: Ongoing (see interventions/notes)

## 2020-12-02 NOTE — Progress Notes (Signed)
[facility]  OB POST PARTUM PROGRESS NOTE      [name], [name]  Date of Admission:  12/01/2020  Date of Birth:  [birth year]  MRN:  [id number]  CSN:   [id number]  Date of Service:  12/02/2020    Post partum day 1,  Type of Delivery:  low trans. Cesarean section    Subjective:  Doing well this AM.  She does report pain is better controlled now.  She denies passing flatus.  Has not ambulated yet.  Tolerating PO.  Foley catheter in place    RH:  +GBS:  - Rubella:  immune Infant Gender:  Female  Baby is feeding via bottle    Vital Signs:    Temp (24hrs) Max:37.2 C (98.9 F)    Temp (48hrs) Max:37.2 C (98.9 F)    Temperature: 36.8 C (98.3 F)  BP (Non-Invasive): (!) 91/55  MAP (Non-Invasive): 67 mmHG  Heart Rate: 64  Respiratory Rate: 16  SpO2: 99 %    I/O:  I/O last 24 hours:      Intake/Output Summary (Last 24 hours) at 12/02/2020 1307  Last data filed at 12/02/2020 1030  Gross per 24 hour   Intake 1328 ml   Output 1150 ml   Net 178 ml     I/O current shift:  08/28 0700 - 08/28 1859  In: -   Out: 50 [Urine:50]    Labs:  I have reviewed all lab results.  Lab Results for Last 24 Hours:    Results for orders placed or performed during the hospital encounter of 12/01/20 (from the past 24 hour(s))   COVID-19 SCREENING - Asymptomatic - NON-PUI   Result Value Ref Range    SARS-CoV-2 Not Detected Not Detected   TYPE AND SCREEN   Result Value Ref Range    UNITS ORDERED NOT STATED         ABO/RH(D) O POSITIVE     ANTIBODY SCREEN NEGATIVE     SPECIMEN EXPIRATION DATE 12/04/2020,2359    CBC WITH DIFF   Result Value Ref Range    WBC 8.1 3.7 - 11.0 x10^3/uL    RBC 3.72 (L) 3.85 - 5.22 x10^6/uL    HGB 10.0 (L) 11.5 - 16.0 g/dL    HCT 49.7 (L) 53.0 - 46.0 %    MCV 87.9 78.0 - 100.0 fL    MCH 26.9 26.0 - 32.0 pg    MCHC 30.6 (L) 31.0 - 35.5 g/dL    RDW-CV 05.1 10.2 - 11.1 %    PLATELETS 219 150 - 400 x10^3/uL    MPV 10.4 8.7 - 12.5 fL    NEUTROPHIL % 69 %    LYMPHOCYTE % 21 %    MONOCYTE % 9 %    EOSINOPHIL % 0 %     BASOPHIL % 0 %    NEUTROPHIL # 5.60 1.50 - 7.70 x10^3/uL    LYMPHOCYTE # 1.72 1.00 - 4.80 x10^3/uL    MONOCYTE # 0.72 0.20 - 1.10 x10^3/uL    EOSINOPHIL # <0.10 <=0.50 x10^3/uL    BASOPHIL # <0.10 <=0.20 x10^3/uL    IMMATURE GRANULOCYTE % 1 0 - 1 %    IMMATURE GRANULOCYTE # <0.10 <0.10 x10^3/uL   ABO & RH   Result Value Ref Range    ABO/RH(D) O POSITIVE      Gen:  NAD  CV:  RRR, S1S2, no murmur  Lungs:  CTAB  Abd:  fundus firm at umbilicus, incision C/D/I  Ext:  no calf TTP, neg homan's sign    Assessment/ Plan:  Active Hospital Problems   (*Primary Problem)    Diagnosis   . *Postpartum care following cesarean delivery   . S/P cesarean section   . Labor abnormal   . Multigravida of advanced maternal age in third trimester   . History of 2019 novel coronavirus disease (COVID-19)     Jan 2022     . Excessive fetal growth affecting management of pregnancy in third trimester   . Polyhydramnios in third trimester   . Uses Spanish as primary spoken language   . Onset (spontaneous) of labor after 37 completed weeks of gestation but before 93 completed weeks gestation, with delivery by (planned) cesarean section     41 yo G2P2 s/p RCS for active labor, declines TOLAC POD#1  continue routine postpartum care  rh pos/RI  Iron def anemia- Hgb 10.0-->ordered for AM.  Ferrous sulfate ordered

## 2020-12-02 NOTE — Nurses Notes (Signed)
Interpreter used #[id number] Jerene Dilling

## 2020-12-02 NOTE — Nurses Notes (Signed)
Pt transferred from L@D  recovery via bed to room 701. Bedside report from Mick Sell, RN. Assumed care of pt at this time.

## 2020-12-02 NOTE — Anesthesia Postprocedure Evaluation (Signed)
Anesthesia Post Op Evaluation    Patient: [name]  Procedure(s):  CESAREAN SECTION    Last Vitals:Temperature: 36.8 C (98.2 F) (12/02/20 0337)  Heart Rate: 64 (12/02/20 0337)  BP (Non-Invasive): 100/60 (12/02/20 0337)  Respiratory Rate: 16 (12/02/20 0337)  SpO2: 99 % (12/02/20 0337)    No complications documented.    Patient is sufficiently recovered from the effects of anesthesia to participate in the evaluation and has returned to their pre-procedure level.       Level of consciousness: awake and alert and responsive to verbal stimuli    Pain management: adequate  Airway patency: patent    Anesthetic complications: no  Cardiovascular status: acceptable  Respiratory status: acceptable  Hydration status: acceptable  Patient post-procedure temperature: Pt Normothermic   PONV Status: Absent

## 2020-12-02 NOTE — Care Plan (Signed)
Problem: Adult Inpatient Plan of Care  Goal: Plan of Care Review  Outcome: Ongoing (see interventions/notes)  Goal: Patient-Specific Goal (Individualized)  Outcome: Ongoing (see interventions/notes)  Goal: Absence of Hospital-Acquired Illness or Injury  Outcome: Ongoing (see interventions/notes)  Intervention: Identify and Manage Fall Risk  Recent Flowsheet Documentation  Taken 12/02/2020 2148 by Shelby Dubin, RN  Safety Promotion/Fall Prevention: safety round/check completed  Intervention: Prevent Skin Injury  Recent Flowsheet Documentation  Taken 12/02/2020 2148 by Shelby Dubin, RN  Body Position:   supine, head elevated   positioned/repositioned independently  Intervention: Prevent and Manage VTE (Venous Thromboembolism) Risk  Recent Flowsheet Documentation  Taken 12/02/2020 2148 by Shelby Dubin, RN  VTE Prevention/Management:   ambulation promoted   dorsiflexion/plantar flexion performed  Intervention: Prevent Infection  Recent Flowsheet Documentation  Taken 12/02/2020 2148 by Shelby Dubin, RN  Infection Prevention:   barrier precautions utilized   promote handwashing   rest/sleep promoted   visitors restricted/screened   single patient room provided  Goal: Optimal Comfort and Wellbeing  Outcome: Ongoing (see interventions/notes)  Intervention: Provide Person-Centered Care  Recent Flowsheet Documentation  Taken 12/02/2020 2148 by Shelby Dubin, RN  Trust Relationship/Rapport:   care explained   choices provided   questions answered   questions encouraged   thoughts/feelings acknowledged  Goal: Rounds/Family Conference  Outcome: Ongoing (see interventions/notes)     Problem: Adjustment to Role Transition (Postpartum Cesarean Delivery)  Goal: Successful Maternal Role Transition  Outcome: Ongoing (see interventions/notes)     Problem: Bleeding (Postpartum Cesarean Delivery)  Goal: Hemostasis  Outcome: Ongoing (see interventions/notes)     Problem: Infection (Postpartum Cesarean Delivery)  Goal: Absence of  Infection Signs and Symptoms  Outcome: Ongoing (see interventions/notes)     Problem: Pain (Postpartum Cesarean Delivery)  Goal: Acceptable Pain Control  Outcome: Ongoing (see interventions/notes)     Problem: Postoperative Nausea and Vomiting (Postpartum Cesarean Delivery)  Goal: Nausea and Vomiting Relief  Outcome: Ongoing (see interventions/notes)     Problem: Postoperative Urinary Retention (Postpartum Cesarean Delivery)  Goal: Effective Urinary Elimination  Outcome: Ongoing (see interventions/notes)

## 2020-12-02 NOTE — Nurses Notes (Signed)
Patient ambulated to bathroom to remove foley catheter. Tolerated well. Ambulated back to bed with no complaints.

## 2020-12-02 NOTE — Nurses Notes (Signed)
Pt arrived to L&D w/ c/o ctx every 2 mins. +FM, denies vaginal bleeding, scheduled for a c-section 12/11/20. Magnus Ivan, CNM and Dr. Weyman Croon made aware of pt's arrival and status.

## 2020-12-02 NOTE — Anesthesia Transfer of Care (Signed)
ANESTHESIA TRANSFER OF CARE   [name] is a 40 y.o. ,female, Weight: 68.5 kg (151 lb)   had Procedure(s):  CESAREAN SECTION  performed  12/02/20   Primary Service: Virgilio Belling, MD    Past Medical History:   Diagnosis Date   . History of 2019 novel coronavirus disease (COVID-19) 12/01/2020      Allergy History as of 12/02/20      No Known Allergies              I completed my transfer of care / handoff to the receiving personnel during which we discussed:  Access, Airway, All key/critical aspects of case discussed, Analgesia, Antibiotics, Expectation of post procedure, Fluids/Product, Gave opportunity for questions and acknowledgement of understanding and PMHx    Post Location: L&D room                                                                  Last OR Temp: Temperature: 36.4 C (97.5 F)  ABG:  POTASSIUM   Date Value Ref Range Status   05/01/2020 4.0 3.5 - 5.1 mmol/L Final     KETONES   Date Value Ref Range Status   05/01/2020 Negative Negative mg/dL Final     CALCIUM   Date Value Ref Range Status   05/01/2020 9.0 8.5 - 10.0 mg/dL Final     Airway:* No LDAs found *  Blood pressure (!) 94/50, pulse 79, temperature 36.4 C (97.5 F), resp. rate 16, height 1.499 m (4\' 11" ), weight 68.5 kg (151 lb), SpO2 100 %.

## 2020-12-02 NOTE — Lactation Note (Signed)
This note was copied from a baby's chart.  Mother asked if she will be breastfeeding her baby. Mother states YES. She plans to breast and bottle field per pt request.     Contraindications to breastfeeding reviewed with mother, which may include: infant with galactosemia, mother with HIV, untreated brucellosis, taking antiretroviral medications, infected with human T-cell lymphotrophic virus type 1 or I. Mother taking certain chemotherapy agents, certain prescribed and over the counter medications, and/or dependent on illicit addictive drugs and not in drug treatment program may have a contraindication to breastfeeding. These drugs will be reviewed by the baby's medical provider.    Mother has no contraindication(s) to breastfeeding. The baby's medical provider will be notified for any contraindications to breastfeeding.     "What to expect in our care" handout provided and reviewed with mother.

## 2020-12-03 LAB — CBC WITH DIFF
BASOPHIL #: <0.1 10*3/uL (ref <=0.20)
BASOPHIL %: 0 %
EOSINOPHIL #: <0.1 10*3/uL (ref <=0.50)
EOSINOPHIL %: 0 %
HCT: 27.1 % — ABNORMAL LOW (ref 34.8–46.0)
HGB: 8.3 g/dL — ABNORMAL LOW (ref 11.5–16.0)
IMMATURE GRANULOCYTE #: <0.1 10*3/uL (ref <0.10)
IMMATURE GRANULOCYTE %: 1 % (ref 0–1)
LYMPHOCYTE #: 1.63 10*3/uL (ref 1.00–4.80)
LYMPHOCYTE %: 17 %
MCH: 26.9 pg (ref 26.0–32.0)
MCHC: 30.6 g/dL — ABNORMAL LOW (ref 31.0–35.5)
MCV: 88 fL (ref 78.0–100.0)
MONOCYTE #: 0.63 10*3/uL (ref 0.20–1.10)
MONOCYTE %: 6 %
MPV: 10.3 fL (ref 8.7–12.5)
NEUTROPHIL #: 7.47 10*3/uL (ref 1.50–7.70)
NEUTROPHIL %: 76 %
PLATELETS: 225 10*3/uL (ref 150–400)
RBC: 3.08 10*6/uL — ABNORMAL LOW (ref 3.85–5.22)
RDW-CV: 14.8 % (ref 11.5–15.5)
WBC: 9.9 10*3/uL (ref 3.7–11.0)

## 2020-12-03 MED ORDER — IBUPROFEN 800 MG TABLET
800.0000 mg | ORAL_TABLET | Freq: Four times a day (QID) | ORAL | 1 refills | Status: DC | PRN
Start: 2020-12-03 — End: 2021-04-13

## 2020-12-03 MED ORDER — FERROUS SULFATE 325 MG (65 MG IRON) TABLET
325.0000 mg | ORAL_TABLET | ORAL | 0 refills | Status: DC
Start: 2020-12-05 — End: 2021-04-13

## 2020-12-03 MED ORDER — SENNOSIDES 8.6 MG TABLET
17.2000 mg | ORAL_TABLET | Freq: Every day | ORAL | 0 refills | Status: DC | PRN
Start: 2020-12-03 — End: 2021-04-13

## 2020-12-03 MED ORDER — OXYCODONE 5 MG TABLET
5.0000 mg | ORAL_TABLET | Freq: Four times a day (QID) | ORAL | 0 refills | Status: DC | PRN
Start: 2020-12-03 — End: 2021-04-13

## 2020-12-03 NOTE — Care Plan (Signed)
Problem: Adult Inpatient Plan of Care  Goal: Plan of Care Review  12/03/2020 1013 by Holley Raring, RN  Outcome: Adequate for Discharge  12/03/2020 0813 by Holley Raring, RN  Outcome: Ongoing (see interventions/notes)  Goal: Patient-Specific Goal (Individualized)  12/03/2020 1013 by Holley Raring, RN  Outcome: Adequate for Discharge  12/03/2020 0813 by Holley Raring, RN  Outcome: Ongoing (see interventions/notes)  Goal: Absence of Hospital-Acquired Illness or Injury  12/03/2020 1013 by Holley Raring, RN  Outcome: Adequate for Discharge  12/03/2020 0813 by Holley Raring, RN  Outcome: Ongoing (see interventions/notes)  Intervention: Identify and Manage Fall Risk  Recent Flowsheet Documentation  Taken 12/03/2020 0745 by Holley Raring, RN  Safety Promotion/Fall Prevention:   activity supervised   nonskid shoes/slippers when out of bed   safety round/check completed  Intervention: Prevent Skin Injury  Recent Flowsheet Documentation  Taken 12/03/2020 0745 by Holley Raring, RN  Body Position: semi-fowlers (less than 30 degrees)  Intervention: Prevent and Manage VTE (Venous Thromboembolism) Risk  Recent Flowsheet Documentation  Taken 12/03/2020 0745 by Holley Raring, RN  VTE Prevention/Management: (pt up walking)   ambulation promoted   sequential compression devices off  Intervention: Prevent Infection  Recent Flowsheet Documentation  Taken 12/03/2020 0745 by Holley Raring, RN  Infection Prevention:   barrier precautions utilized   cohorting utilized   environmental surveillance performed   equipment surfaces disinfected   personal protective equipment utilized   promote handwashing   rest/sleep promoted  Goal: Optimal Comfort and Wellbeing  12/03/2020 1013 by Holley Raring, RN  Outcome: Adequate for Discharge  12/03/2020 0813 by Holley Raring, RN  Outcome: Ongoing (see interventions/notes)  Intervention: Provide Person-Centered Care  Recent Flowsheet Documentation  Taken 12/03/2020 0745 by Holley Raring,  RN  Trust Relationship/Rapport:   care explained   choices provided   emotional support provided   empathic listening provided   questions answered   reassurance provided   questions encouraged   thoughts/feelings acknowledged  Goal: Rounds/Family Conference  12/03/2020 1013 by Holley Raring, RN  Outcome: Adequate for Discharge  12/03/2020 0813 by Holley Raring, RN  Outcome: Ongoing (see interventions/notes)     Problem: Adjustment to Role Transition (Postpartum Cesarean Delivery)  Goal: Successful Maternal Role Transition  12/03/2020 1013 by Holley Raring, RN  Outcome: Adequate for Discharge  12/03/2020 0813 by Holley Raring, RN  Outcome: Ongoing (see interventions/notes)     Problem: Bleeding (Postpartum Cesarean Delivery)  Goal: Hemostasis  12/03/2020 1013 by Holley Raring, RN  Outcome: Adequate for Discharge  12/03/2020 0813 by Holley Raring, RN  Outcome: Ongoing (see interventions/notes)     Problem: Infection (Postpartum Cesarean Delivery)  Goal: Absence of Infection Signs and Symptoms  12/03/2020 1013 by Holley Raring, RN  Outcome: Adequate for Discharge  12/03/2020 0813 by Holley Raring, RN  Outcome: Ongoing (see interventions/notes)     Problem: Pain (Postpartum Cesarean Delivery)  Goal: Acceptable Pain Control  12/03/2020 1013 by Holley Raring, RN  Outcome: Adequate for Discharge  12/03/2020 0813 by Holley Raring, RN  Outcome: Ongoing (see interventions/notes)     Problem: Postoperative Nausea and Vomiting (Postpartum Cesarean Delivery)  Goal: Nausea and Vomiting Relief  12/03/2020 1013 by Holley Raring, RN  Outcome: Adequate for Discharge  12/03/2020 0813 by Holley Raring, RN  Outcome: Ongoing (see interventions/notes)     Problem: Postoperative Urinary Retention (Postpartum Cesarean Delivery)  Goal: Effective Urinary Elimination  12/03/2020 1013 by Holley Raring, RN  Outcome: Adequate for Discharge  12/03/2020 0813 by Holley Raring, RN  Outcome:  Ongoing (see interventions/notes)      Problem: Breastfeeding  Goal: Effective Breastfeeding  12/03/2020 1013 by Holley Raring, RN  Outcome: Adequate for Discharge  12/03/2020 0813 by Holley Raring, RN  Outcome: Ongoing (see interventions/notes)

## 2020-12-03 NOTE — Nurses Notes (Addendum)
Patient discharged home with family.  AVS reviewed with patient/care giver through an interpretor : Rubin Payor # [phone].  A written copy of the AVS and discharge instructions was given to the patient/care giver.  Questions sufficiently answered as needed.  Patient/care giver encouraged to follow up with PCP as indicated.  In the event of an emergency, patient/care giver instructed to call 911 or go to the nearest emergency room.   Pt was taken down to car at the front of the hospital by w/c, escorted by RN. Holley Raring, RN

## 2020-12-03 NOTE — Nurses Notes (Signed)
Pt is resting in bed. She just went to the bathroom and voided without difficulty. Assessment completed. Pt states that her pain level is "5/10", but did not want anything for pain at this time. Abd incision OTA, WA, no drainage noted. She is wearing an abd binder which she states helps. Pt and SO are caring for their baby well. She is mostly bottle feeding at this time, but is encouraged to breastfeed more and call for help. Holley Raring, RN

## 2020-12-03 NOTE — Lactation Note (Signed)
This note was copied from a baby's chart.  Remote Interpreter Mikle Bosworth [phone] used for this session.    Infant with energy for sucking. Mom reports she planned to breastfeed and give formula. Reports infant seems very hungry at the breast and suck is too strong.     Discussed normal course of increased milk flow after day 3.    Offered to assist with infant at breast and observe behavior. Interpreter camera turned off. Infant to breast. Latching vigorously and pulling off. Infant becoming frustrated. Drops seen by hand expression. Discussed feeding partial feed by bottle to calm infant and attempt relatching at breast. Discussed positioning and applying infant to breast. Mom reports understanding.   Encouraged calling for help as needed.    Lactation contact information provided. Reports not having a breast pump or having insurance/medicaid. Hand pump given and discussed use if needed.

## 2020-12-03 NOTE — Discharge Summary (Signed)
[facility]  DISCHARGE SUMMARY - OBSTETRICS      PATIENT NAME:  [name], [name]  MRN:  [id number]  DOB:  [birth year]    Encounter Start Date:  12/01/2020  Inpatient Admission Date: 12/01/2020  DISCHARGE DATE:  12/03/2020    ATTENDING PHYSICIAN: Virgilio Belling, MD  PRIMARY CARE PHYSICIAN: No Pcp     ADMISSION DIAGNOSIS: Postpartum care following cesarean delivery    DISCHARGE DIAGNOSIS:   Hospital Problems) (* Primary Problem)    Diagnosis Date Noted   . *Postpartum care following cesarean delivery 12/02/2020   . S/P cesarean section 12/01/2020   . Labor abnormal 12/01/2020   . Multigravida of advanced maternal age in third trimester 12/01/2020   . History of 2019 novel coronavirus disease (COVID-19) 12/01/2020     Jan 2022     . Excessive fetal growth affecting management of pregnancy in third trimester 12/01/2020   . Polyhydramnios in third trimester 12/01/2020   . Uses Spanish as primary spoken language 12/01/2020   . Onset (spontaneous) of labor after 37 completed weeks of gestation but before 39 completed weeks gestation, with delivery by (planned) cesarean section 12/01/2020      Resolved Hospital Problems   No resolved problems to display.     There are no active non-hospital problems to display for this patient.     DISCHARGE MEDICATIONS:     Current Discharge Medication List      START taking these medications.      Details   ferrous sulfate 325 mg (65 mg iron) Tablet  Commonly known as: FEOSOL  Start taking on: December 05, 2020   325 mg, Oral, EVERY OTHER DAY  Qty: 60 Tablet  Refills: 0     Ibuprofen 800 mg Tablet  Commonly known as: MOTRIN   800 mg, Oral, EVERY 6 HOURS PRN  Qty: 40 Tablet  Refills: 1     oxyCODONE 5 mg Tablet  Commonly known as: ROXICODONE   5 mg, Oral, EVERY 6 HOURS PRN  Qty: 15 Tablet  Refills: 0     sennosides 8.6 mg Tablet  Commonly known as: SENNA   17.2 mg, Oral, DAILY PRN  Qty: 60 Tablet  Refills: 0        CONTINUE these medications - NO CHANGES were made during  your visit.      Details   acetaminophen 325 mg Tablet  Commonly known as: TYLENOL   325 mg, Oral, EVERY 4 HOURS PRN  Refills: 0          DISCHARGE INSTRUCTIONS:      DISCHARGE INSTRUCTION - MISC    Diet - Regular  Activity - no heavy lifting, pushing, or pulling for 6 weeks  Nothing in the vagina for 6 weeks.  No driving for 2 weeks and while narcotic pain medication.  You have surgical glue on your incision and this will come of on it's own.  Shower instead of bathe, at least until bleeding stops and wound has healed.  Call for any fevers, chills, increased pain or bleeding, nausea, vomiting, diarrhea, constipation, burning with urination, drainage or bleeding from incision, redness or warmth of incision.  Call with any depression symptoms.  Keep your appointment in 6 weeks for your post-partum exam.  [phone]      Follow-up Information     Virgilio Belling, MD On 01/14/2021.    Specialty: OBSTETRICS-GYNECOLOGY  Why: @9 :00 for postpartum follow up appt.  Contact information:  [address]  [place]  [place] [address]  [phone]                       REASON FOR HOSPITALIZATION AND HOSPITAL COURSE:  This is a 40 y.o., female admitted for onset of labor.        SIGNIFICANT LAB: Results for FLORDIA, KASSEM (MRN [id number]) as of 12/03/2020 09:50   Ref. Range 12/01/2020 22:43 12/01/2020 22:57 12/03/2020 05:13   HGB Latest Ref Range: 11.5 - 16.0 g/dL 34.2 (L)  8.3 (L)   HCT Latest Ref Range: 34.8 - 46.0 % 32.7 (L)  27.1 (L)     SIGNIFICANT RADIOLOGY: None  CONSULTATIONS: None    Labor Events: None  Procedure:  repeat Cesarean section for  previous uterine surgery and Active Labor using TLCS (Transverse Low Uterine Segment) uterine incision.  Anesthesia:  spinal  Postpartum Complications: None    [name], Girl [[id number]]    Delivery Information    Birth date/time: 12/01/2019 2318  Sex: Female  Delivery type: C-Section, Low Transverse  C-Section categorization: Repeat  C-Section priority:  Non-Scheduled  Incision type: Low Transverse  Complications: None     Newborn Measurements    Weight: 3770 g  Length: 52.1 cm  Head circumference: 34.3 cm     Newborn  Apgars    Living status: Living      Skin color:    Heart rate:    Reflex Irrit:    Muscle tone:    Resp. effort:    Total:     1 Min:    0    2    2    2    2    8     5  Min:    1    2    2    2    2    9     10  Min:     15 Min:     20 Min:       Apgars assigned by: RNC            COURSE IN HOSPITAL: 40 yo G2 now P2 who presented 38.4 weeks with active labor 8cm on arrival.  Pt declined TOLAC.   Pregnancy complicated by CSX1, h/o ICP, AMA followed with MFM low risk cfDNA, LGA with last EFW 7/19- 2904g >99%, Polyhydramnios resolved on last MFM scan AFI 21cm and Spanish speaking.  Viable female 3770g apgars 8,9 delivered via RCS.  Postpartum course complicated by acute blood loss anemia Hgb 8.3 started on iron supplement.  Discharged home on POD#2.    CONDITION ON DISCHARGE: Alert, Oriented and VS Stable    DISCHARGE DISPOSITION:  Home discharge     Copies sent to Care Team       Relationship Specialty Notifications Start End    Pcp, No PCP - General   05/01/20           06-11-2001, MD

## 2020-12-03 NOTE — Progress Notes (Signed)
[facility]  OB POST PARTUM PROGRESS NOTE      [name], [name]  Date of Admission:  12/01/2020  Date of Birth:  [birth year]  MRN:  [id number]  CSN:   [id number]  Date of Service:  12/03/2020    Post partum day 2,  Type of Delivery:  low trans. Cesarean section    Subjective:  Doing well this AM.  Ready to go home.  Voiding.  Tolerating PO.  Passing flatus.  Lochia light.     RH:  +GBS:  - Rubella:  immune Infant Gender:  Female  Baby is feeding via bottle    Vital Signs:    Temp (24hrs) Max:37.1 C (98.8 F)    Temp (48hrs) Max:37.2 C (98.9 F)    Temperature: 37.1 C (98.8 F)  BP (Non-Invasive): 98/63  MAP (Non-Invasive): 72 mmHG  Heart Rate: 74  Respiratory Rate: 16  SpO2: 99 %    I/O:  I/O last 24 hours:      Intake/Output Summary (Last 24 hours) at 12/03/2020 0947  Last data filed at 12/03/2020 0118  Gross per 24 hour   Intake 480 ml   Output 650 ml   Net -170 ml     I/O current shift:  No intake/output data recorded.    Labs:  I have reviewed all lab results.  Lab Results for Last 24 Hours:    Results for orders placed or performed during the hospital encounter of 12/01/20 (from the past 24 hour(s))   CBC WITH DIFF   Result Value Ref Range    WBC 9.9 3.7 - 11.0 x10^3/uL    RBC 3.08 (L) 3.85 - 5.22 x10^6/uL    HGB 8.3 (L) 11.5 - 16.0 g/dL    HCT 53.9 (L) 76.7 - 46.0 %    MCV 88.0 78.0 - 100.0 fL    MCH 26.9 26.0 - 32.0 pg    MCHC 30.6 (L) 31.0 - 35.5 g/dL    RDW-CV 34.1 93.7 - 90.2 %    PLATELETS 225 150 - 400 x10^3/uL    MPV 10.3 8.7 - 12.5 fL    NEUTROPHIL % 76 %    LYMPHOCYTE % 17 %    MONOCYTE % 6 %    EOSINOPHIL % 0 %    BASOPHIL % 0 %    NEUTROPHIL # 7.47 1.50 - 7.70 x10^3/uL    LYMPHOCYTE # 1.63 1.00 - 4.80 x10^3/uL    MONOCYTE # 0.63 0.20 - 1.10 x10^3/uL    EOSINOPHIL # <0.10 <=0.50 x10^3/uL    BASOPHIL # <0.10 <=0.20 x10^3/uL    IMMATURE GRANULOCYTE % 1 0 - 1 %    IMMATURE GRANULOCYTE # <0.10 <0.10 x10^3/uL     Gen:  NAD  CV:  RRR, S1S2, no murmur  Lungs:  CTAB  Abd:  fundus firm  at umbilicus, incision C/D/I  Ext:  no calf TTP, no edema    Assessment/ Plan:  Active Hospital Problems   (*Primary Problem)    Diagnosis   . *Postpartum care following cesarean delivery   . S/P cesarean section   . Labor abnormal   . Multigravida of advanced maternal age in third trimester   . History of 2019 novel coronavirus disease (COVID-19)     Jan 2022     . Excessive fetal growth affecting management of pregnancy in third trimester   . Polyhydramnios in third trimester   . Uses Spanish as primary spoken language   . Onset (spontaneous)  of labor after 37 completed weeks of gestation but before 55 completed weeks gestation, with delivery by (planned) cesarean section     40 yo G2P2 s/p RCS for active labor, declines TOLAC POD#2  continue routine postpartum care  rh pos/RI  Iron def anemia- Hgb 10.0-->ordered for AM.  Ferrous sulfate ordered  d/c home  d/c instructions d/w patient

## 2020-12-03 NOTE — Care Plan (Signed)
Problem: Adult Inpatient Plan of Care  Goal: Plan of Care Review  Outcome: Ongoing (see interventions/notes)  Goal: Patient-Specific Goal (Individualized)  Outcome: Ongoing (see interventions/notes)  Goal: Absence of Hospital-Acquired Illness or Injury  Outcome: Ongoing (see interventions/notes)  Intervention: Identify and Manage Fall Risk  Recent Flowsheet Documentation  Taken 12/03/2020 0745 by Holley Raring, RN  Safety Promotion/Fall Prevention:   activity supervised   nonskid shoes/slippers when out of bed   safety round/check completed  Intervention: Prevent Skin Injury  Recent Flowsheet Documentation  Taken 12/03/2020 0745 by Holley Raring, RN  Body Position: semi-fowlers (less than 30 degrees)  Intervention: Prevent and Manage VTE (Venous Thromboembolism) Risk  Recent Flowsheet Documentation  Taken 12/03/2020 0745 by Holley Raring, RN  VTE Prevention/Management: (pt up walking)   ambulation promoted   sequential compression devices off  Intervention: Prevent Infection  Recent Flowsheet Documentation  Taken 12/03/2020 0745 by Holley Raring, RN  Infection Prevention:   barrier precautions utilized   cohorting utilized   environmental surveillance performed   equipment surfaces disinfected   personal protective equipment utilized   promote handwashing   rest/sleep promoted  Goal: Optimal Comfort and Wellbeing  Outcome: Ongoing (see interventions/notes)  Intervention: Provide Person-Centered Care  Recent Flowsheet Documentation  Taken 12/03/2020 0745 by Holley Raring, RN  Trust Relationship/Rapport:   care explained   choices provided   emotional support provided   empathic listening provided   questions answered   reassurance provided   questions encouraged   thoughts/feelings acknowledged  Goal: Rounds/Family Conference  Outcome: Ongoing (see interventions/notes)     Problem: Adjustment to Role Transition (Postpartum Cesarean Delivery)  Goal: Successful Maternal Role Transition  Outcome: Ongoing (see  interventions/notes)     Problem: Bleeding (Postpartum Cesarean Delivery)  Goal: Hemostasis  Outcome: Ongoing (see interventions/notes)     Problem: Infection (Postpartum Cesarean Delivery)  Goal: Absence of Infection Signs and Symptoms  Outcome: Ongoing (see interventions/notes)     Problem: Pain (Postpartum Cesarean Delivery)  Goal: Acceptable Pain Control  Outcome: Ongoing (see interventions/notes)     Problem: Postoperative Nausea and Vomiting (Postpartum Cesarean Delivery)  Goal: Nausea and Vomiting Relief  Outcome: Ongoing (see interventions/notes)     Problem: Postoperative Urinary Retention (Postpartum Cesarean Delivery)  Goal: Effective Urinary Elimination  Outcome: Ongoing (see interventions/notes)     Problem: Breastfeeding  Goal: Effective Breastfeeding  Outcome: Ongoing (see interventions/notes)

## 2020-12-05 ENCOUNTER — Ambulatory Visit (HOSPITAL_BASED_OUTPATIENT_CLINIC_OR_DEPARTMENT_OTHER): Payer: Medicaid Other

## 2020-12-11 ENCOUNTER — Ambulatory Visit (HOSPITAL_COMMUNITY): Admission: RE | Admit: 2020-12-11 | Payer: Self-pay | Source: Ambulatory Visit | Admitting: Obstetrics & Gynecology

## 2021-04-13 ENCOUNTER — Encounter (HOSPITAL_COMMUNITY): Payer: Self-pay

## 2021-04-13 ENCOUNTER — Emergency Department (HOSPITAL_COMMUNITY): Payer: Self-pay

## 2021-04-13 ENCOUNTER — Other Ambulatory Visit: Payer: Self-pay

## 2021-04-13 ENCOUNTER — Emergency Department (EMERGENCY_DEPARTMENT_HOSPITAL): Payer: Self-pay

## 2021-04-13 ENCOUNTER — Emergency Department
Admission: EM | Admit: 2021-04-13 | Discharge: 2021-04-13 | Disposition: A | Discharge status: Home / Self Care | Payer: Self-pay | Attending: Emergency Medicine | Admitting: Emergency Medicine

## 2021-04-13 DIAGNOSIS — M5127 Other intervertebral disc displacement, lumbosacral region: Secondary | ICD-10-CM | POA: Insufficient documentation

## 2021-04-13 DIAGNOSIS — N2889 Other specified disorders of kidney and ureter: Secondary | ICD-10-CM | POA: Insufficient documentation

## 2021-04-13 DIAGNOSIS — N281 Cyst of kidney, acquired: Secondary | ICD-10-CM

## 2021-04-13 DIAGNOSIS — M5117 Intervertebral disc disorders with radiculopathy, lumbosacral region: Secondary | ICD-10-CM | POA: Insufficient documentation

## 2021-04-13 DIAGNOSIS — M5126 Other intervertebral disc displacement, lumbar region: Secondary | ICD-10-CM

## 2021-04-13 DIAGNOSIS — M5442 Lumbago with sciatica, left side: Secondary | ICD-10-CM

## 2021-04-13 MED ORDER — OXYCODONE-ACETAMINOPHEN 5 MG-325 MG TABLET - ED TO GO MED
1.0000 | ORAL_TABLET | Freq: Four times a day (QID) | ORAL | Status: DC | PRN
Start: 2021-04-13 — End: 2021-04-13
  Administered 2021-04-13: 2 via ORAL
  Filled 2021-04-13: qty 2

## 2021-04-13 MED ORDER — CYCLOBENZAPRINE 10 MG TABLET
10.0000 mg | ORAL_TABLET | Freq: Three times a day (TID) | ORAL | 0 refills | Status: AC | PRN
Start: 2021-04-13 — End: ?

## 2021-04-13 MED ORDER — OXYCODONE-ACETAMINOPHEN 5 MG-325 MG TABLET
1.0000 | ORAL_TABLET | Freq: Four times a day (QID) | ORAL | 0 refills | Status: AC | PRN
Start: 2021-04-13 — End: ?

## 2021-04-13 MED ORDER — CYCLOBENZAPRINE 10 MG TABLET - ED TO GO MED
5.0000 mg | ORAL_TABLET | Freq: Three times a day (TID) | ORAL | Status: DC | PRN
Start: 2021-04-13 — End: 2021-04-13
  Administered 2021-04-13: 10 mg via ORAL
  Filled 2021-04-13: qty 2

## 2021-04-13 MED ORDER — KETOROLAC 60 MG/2 ML INTRAMUSCULAR SOLUTION
60.0000 mg | INTRAMUSCULAR | Status: AC
Start: 2021-04-13 — End: 2021-04-13
  Administered 2021-04-13: 60 mg via INTRAMUSCULAR
  Filled 2021-04-13: qty 2

## 2021-04-13 MED ORDER — ORPHENADRINE CITRATE 30 MG/ML INJECTION SOLUTION
30.0000 mg | INTRAMUSCULAR | Status: AC
Start: 2021-04-13 — End: 2021-04-13
  Administered 2021-04-13: 30 mg via INTRAVENOUS
  Filled 2021-04-13: qty 2

## 2021-04-13 MED ORDER — PREDNISONE 20 MG TABLET
60.0000 mg | ORAL_TABLET | Freq: Every day | ORAL | 0 refills | Status: AC
Start: 2021-04-13 — End: 2021-04-18

## 2021-04-13 NOTE — ED Nurses Note (Signed)
Discharge instructions given written and verbal. Meds given to go along w/ rx's sent to [facility] pharmacy. Verbalized understanding of d/c and follow up.

## 2021-04-13 NOTE — ED Provider Notes (Signed)
[facility]ella Ric[facility] Deavin Forst, [facility] - Emergency Department  ED Attending Note        Chief Complaint   Patient presents with   . Low Back Pain       HISTORY OF PRESENT ILLNESS     [name], date of birth Jun 15, 1980, is a 41 y.o.female who presents to the Emergency Department with acute low back pain.  Patient bent over to pick something up in the kitchen when standing up she felt a "pop" this heavily care at breaking her back.  She had immediate low back pain that she rated 10/10 with left-sided radiculopathy.  She is able to move her leg but feels numb.  She has no other complaints.    History obtained from:  Patient and cousin     PATIENT HISTORY     Past Medical History:  Past Medical History:   Diagnosis Date   . History of 2019 novel coronavirus disease (COVID-19) 12/01/2020       Past Surgical History:  Past Surgical History:   Procedure Laterality Date   . Hx cesarean section         Family History:  Family Medical History:    None           Social History:  Social History     Tobacco Use   . Smoking status: Never   . Smokeless tobacco: Never   Vaping Use   . Vaping Use: Never used   Substance Use Topics   . Alcohol use: Never   . Drug use: Never       Medications:  Current Outpatient Medications   Medication Sig   . cyclobenzaprine (FLEXERIL) 10 mg Oral Tablet Take 1 Tablet (10 mg total) by mouth Three times a day as needed for Muscle spasms   . oxyCODONE-acetaminophen (PERCOCET) 5-325 mg Oral Tablet Take 1 Tablet by mouth Every 6 hours as needed for Pain   . predniSONE (DELTASONE) 20 mg Oral Tablet Take 3 Tablets (60 mg total) by mouth Once a day for 5 days       Allergies:  No Known Allergies    PHYSICAL EXAM     Vitals:  ED Triage Vitals [04/13/21 1949]   BP (Non-Invasive) 107/66   Heart Rate 77   Respiratory Rate (!) 22   Temperature 36.8 C (98.2 F)   SpO2 98 %   Weight 70.3 kg (155 lb)   Height 1.499 m (4\' 11" )     Constitutional: The patient is alert, oriented,  non toxic  Head: Atraumatic, normocephalic  Chest: Atraumatic, no tenderness to palpation, clear and equal breath sounds bilaterally. No wheezes, rhonchi or rales.  No accessory muscle use.  Cardiovascular: Heart is S1-S2, regular rate and rhythm without murmur, click, gallop or rub.  Abdomen: Soft, non-tender, non-distended without evidence of rebound or guarding, no gross organomegaly or other palpatory masses.   Skin: No cyanosis, jaundice, rash or lesion.  Neurologic: Alert, oriented, normal facial symmetry and speech, Normal upper and lower extremity strength and grossly normal sensation.   Musculoskeletal:  Lumbar paraspinal tenderness otherwise No deformities, trauma, edema.  Vascular: Normal peripheral pulses 4/4 with brisk capillary refills of less than 2 seconds.     DIAGNOSTIC STUDIES          Radiology:    CT LUMBAR SPINE WO IV CONTRAST   Final Result   Abnormal      Central disc herniation at L5-S1.  A 9 mm exophytic density arising from the upper pole of the left kidney which cannot be characterized as a cyst on this exam. Recommend ultrasound for further evaluation.            Radiologist location ID: [id number]           I independently reviewed the patients imaging. Radiological imaging interpreted by radiologist and report also reviewed by myself.       ED PROGRESS NOTE / MEDICAL DECISION MAKING          Orders Placed This Encounter   . CT LUMBAR SPINE WO IV CONTRAST   . SCHEDULE FOLLOW-UP - NEUROSURGERY - [place]   . orphenadrine (NORFLEX) 30 mg/mL injection   . ketorolac (TORADOL) 60mg /2 mL IM injection   . oxyCODONE-acetaminophen (PERCOCET) 5-325 mg Oral Tablet   . predniSONE (DELTASONE) 20 mg Oral Tablet   . cyclobenzaprine (FLEXERIL) 10 mg Oral Tablet   . cyclobenzaprine (FLEXERIL) 10 mg tablet - ED To Go   . oxyCODONE-acetaminophen (PERCOCET) 5-325mg  per tablet - ED To Go          ED Course as of 04/13/21 2146   Sat Apr 13, 2021   2000 I initially evaluated the patient. The plan will be  for CT lumbar spine, norflex and Toradol.  It appears the patient likely has acute lumbar dics pathology with radiculopathy     2058 CT LUMBAR SPINE WO IV CONTRAST(!)  CT scan reviewed.  There is an L5-S1 disc herniation likely causing her symptoms.  She has no signs of weakness or acute cord syndrome.  Plan will be to discharge her with steroids and pain control.  I will then subsequently have her follow-up with Neurosurgery.  She also has a cyst noted on her kidney which will need outpatient follow-up.  I will be sure to give her instructions on following up with family doctor for this follow-up.  She was understandable agreeable and had no other questions or concerns        Pre-Disposition Vitals:  Filed Vitals:    04/13/21 1949   BP: 107/66   Pulse: 77   Resp: (!) 22   Temp: 36.8 C (98.2 F)   SpO2: 98%       CLINICAL IMPRESSION     1. Acute Lumbar Back Pain  2. Acute L5/S1 Disc Herniation  3. Left Renal Mass    DISPOSITION/PLAN     Discharged    Prescriptions:     Discharge Medication List as of 04/13/2021  9:05 PM      START taking these medications    Details   cyclobenzaprine (FLEXERIL) 10 mg Oral Tablet Take 1 Tablet (10 mg total) by mouth Three times a day as needed for Muscle spasms, Disp-12 Tablet, R-0, E-Rx      oxyCODONE-acetaminophen (PERCOCET) 5-325 mg Oral Tablet Take 1 Tablet by mouth Every 6 hours as needed for Pain, Disp-12 Tablet, R-0, E-Rx      predniSONE (DELTASONE) 20 mg Oral Tablet Take 3 Tablets (60 mg total) by mouth Once a day for 5 days, Disp-15 Tablet, R-0, E-Rx              Follow-Up:     Glenetta Borg, MD  [address]  [place] [address]  [phone]      Call as soon as possible for follow up in 2 days with neurosurgery    Hubbard Robinson, DO  [address]  [place] [address]  [phone]      Call as soon as possible for follow up in 2 days, Establishment of Primary Care Doctor      Condition at Disposition: Stable

## 2021-04-13 NOTE — ED Triage Notes (Addendum)
Pt arrives with EMS, time out report completed. Pt is spanish speaking.   Per pt, she bent over, heard something crack, stood up, had immediate pain to lower back. 10/10. Was unable to ambulate after incident. Pt also states that her left leg feels as if it's asleep. Per pt, she has had previous back pain, but never this bad. Pt describes pain as sudden jolts through her back.

## 2021-04-13 NOTE — Discharge Instructions (Addendum)
Follow up with Dr. Everardo Beals to evaluate the back  Follow up with Dr. Hyacinth Meeker to evaluate the kidney cyst

## 2022-04-22 ENCOUNTER — Other Ambulatory Visit (HOSPITAL_COMMUNITY): Payer: Self-pay | Admitting: Physician Assistant

## 2022-04-22 DIAGNOSIS — Z1231 Encounter for screening mammogram for malignant neoplasm of breast: Secondary | ICD-10-CM

## 2022-06-10 ENCOUNTER — Ambulatory Visit
Admission: RE | Admit: 2022-06-10 | Discharge: 2022-06-10 | Disposition: A | Discharge status: Home / Self Care | Payer: No Typology Code available for payment source | Source: Ambulatory Visit | Attending: Physician Assistant | Admitting: Physician Assistant

## 2022-06-10 ENCOUNTER — Encounter (HOSPITAL_BASED_OUTPATIENT_CLINIC_OR_DEPARTMENT_OTHER): Payer: Self-pay

## 2022-06-10 ENCOUNTER — Other Ambulatory Visit: Payer: Self-pay

## 2022-06-10 DIAGNOSIS — Z1231 Encounter for screening mammogram for malignant neoplasm of breast: Secondary | ICD-10-CM | POA: Insufficient documentation

## 2023-01-14 ENCOUNTER — Other Ambulatory Visit: Payer: Self-pay

## 2023-01-14 ENCOUNTER — Emergency Department (HOSPITAL_COMMUNITY): Payer: Self-pay

## 2023-01-14 ENCOUNTER — Emergency Department
Admission: EM | Admit: 2023-01-14 | Discharge: 2023-01-15 | Disposition: A | Discharge status: Home / Self Care | Payer: Self-pay | Attending: Emergency Medicine | Admitting: Emergency Medicine

## 2023-01-14 DIAGNOSIS — G43909 Migraine, unspecified, not intractable, without status migrainosus: Secondary | ICD-10-CM | POA: Insufficient documentation

## 2023-01-14 DIAGNOSIS — Z3200 Encounter for pregnancy test, result unknown: Secondary | ICD-10-CM | POA: Insufficient documentation

## 2023-01-14 DIAGNOSIS — R55 Syncope and collapse: Secondary | ICD-10-CM

## 2023-01-14 LAB — COMPREHENSIVE METABOLIC PANEL, NON-FASTING
ALBUMIN: 4.2 g/dL (ref 3.5–5.0)
ALKALINE PHOSPHATASE: 84 U/L (ref 40–110)
ALT (SGPT): 27 U/L — ABNORMAL HIGH (ref 8–22)
ANION GAP: 9 mmol/L (ref 4–13)
AST (SGOT): 19 U/L (ref 8–45)
BILIRUBIN TOTAL: 0.4 mg/dL (ref 0.3–1.3)
BUN/CREA RATIO: 18 (ref 6–22)
BUN: 15 mg/dL (ref 8–25)
CALCIUM: 9 mg/dL (ref 8.6–10.2)
CHLORIDE: 109 mmol/L (ref 96–111)
CO2 TOTAL: 20 mmol/L — ABNORMAL LOW (ref 22–30)
CREATININE: 0.83 mg/dL (ref 0.60–1.05)
GLUCOSE: 125 mg/dL (ref 65–125)
POTASSIUM: 3.7 mmol/L (ref 3.5–5.1)
PROTEIN TOTAL: 7.9 g/dL (ref 6.4–8.3)
SODIUM: 138 mmol/L (ref 136–145)
eGFRcr - FEMALE: >90 mL/min/BSA (ref >=60)

## 2023-01-14 LAB — HCG, PLASMA OR SERUM QUANTITATIVE, PREGNANCY: HCG QUANTITATIVE PREGNANCY: <2 m[IU]/mL (ref <10)

## 2023-01-14 LAB — CBC WITH DIFF
BASOPHIL #: <0.1 10*3/uL (ref <=0.20)
BASOPHIL %: 0.3 %
EOSINOPHIL #: <0.1 10*3/uL (ref <=0.50)
EOSINOPHIL %: 0 %
HCT: 41.4 % (ref 34.8–46.0)
HGB: 14.2 g/dL (ref 11.5–16.0)
IMMATURE GRANULOCYTE #: <0.1 10*3/uL (ref <0.10)
IMMATURE GRANULOCYTE %: 0.4 % (ref 0.0–1.0)
LYMPHOCYTE #: 0.68 10*3/uL — ABNORMAL LOW (ref 1.00–4.80)
LYMPHOCYTE %: 5.3 %
MCH: 30.7 pg (ref 26.0–32.0)
MCHC: 34.3 g/dL (ref 31.0–35.5)
MCV: 89.4 fL (ref 78.0–100.0)
MONOCYTE #: 0.46 10*3/uL (ref 0.20–1.10)
MONOCYTE %: 3.6 %
MPV: 9.3 fL (ref 8.7–12.5)
NEUTROPHIL #: 11.5 10*3/uL — ABNORMAL HIGH (ref 1.50–7.70)
NEUTROPHIL %: 90.4 %
PLATELETS: 315 10*3/uL (ref 150–400)
RBC: 4.63 10*6/uL (ref 3.85–5.22)
RDW-CV: 12.4 % (ref 11.5–15.5)
WBC: 12.7 10*3/uL — ABNORMAL HIGH (ref 3.7–11.0)

## 2023-01-14 MED ORDER — KETOROLAC 30 MG/ML (1 ML) INJECTION SOLUTION
30.0000 mg | INTRAMUSCULAR | Status: AC
Start: 2023-01-14 — End: 2023-01-14
  Administered 2023-01-14: 30 mg via INTRAVENOUS
  Filled 2023-01-14: qty 1

## 2023-01-14 MED ORDER — DIPHENHYDRAMINE 50 MG/ML INJECTION SOLUTION
50.0000 mg | INTRAMUSCULAR | Status: AC
Start: 2023-01-14 — End: 2023-01-14
  Administered 2023-01-14: 50 mg via INTRAVENOUS
  Filled 2023-01-14: qty 1

## 2023-01-14 MED ORDER — PROCHLORPERAZINE EDISYLATE 10 MG/2 ML (5 MG/ML) INJECTION SOLUTION
10.0000 mg | INTRAMUSCULAR | Status: AC
Start: 2023-01-14 — End: 2023-01-14
  Administered 2023-01-14: 10 mg via INTRAVENOUS
  Filled 2023-01-14: qty 2

## 2023-01-14 MED ORDER — DEXAMETHASONE SODIUM PHOSPHATE 10 MG/ML INJECTION SOLUTION
10.0000 mg | INTRAMUSCULAR | Status: AC
Start: 2023-01-14 — End: 2023-01-14
  Administered 2023-01-14: 10 mg via INTRAVENOUS
  Filled 2023-01-14: qty 1

## 2023-01-14 NOTE — ED Provider Notes (Signed)
Domingo Madeira, MD  [facility] - Emergency Department  Emergency Department Visit Note    Chief Complaint: Headache    HISTORY OF PRESENT ILLNESS     [name], date of birth Aug 25, 1980, is a 42 y.o.female who presents to the Emergency Department on 01/14/2023 with headache for the past 4 days.   Patient reports tingling like sensation to left side of head with associated nausea. Patient also had a witnessed syncopal episode around 1900 this evening by her husband, states he caught her prior to her falling. Patient was seen for headache this morning and was discharged with Excedrin. Reports no significant improvement with it. No history of similar episodes in the past. Denies any vomiting, vision changes, chest pain, shortness of breath, diaphoresis, or any measured fevers at this time. No known allergies reported.    Translator used to obtain history (Spanish).    REVIEW OF SYSTEMS     The pertinent positive and negative symptoms are as per HPI. All other systems reviewed and are negative.     PATIENT HISTORY     Past Medical History:  Past Medical History:   Diagnosis Date    History of 2019 novel coronavirus disease (COVID-19) 12/01/2020     Past Surgical History:  Past Surgical History:   Procedure Laterality Date    Hx cesarean section       Family History:  No history of acute family illness given at this time.     Social History:  Social History     Tobacco Use    Smoking status: Never    Smokeless tobacco: Never   Vaping Use    Vaping status: Never Used   Substance Use Topics    Alcohol use: Never    Drug use: Never     Medications:  Current Outpatient Medications   Medication Sig    cyclobenzaprine (FLEXERIL) 10 mg Oral Tablet Take 1 Tablet (10 mg total) by mouth Three times a day as needed for Muscle spasms    oxyCODONE-acetaminophen (PERCOCET) 5-325 mg Oral Tablet Take 1 Tablet by mouth Every 6 hours as needed for Pain     Allergies:  No Known Allergies    PHYSICAL EXAM      Vitals:  ED Triage Vitals   BP (Non-Invasive) 01/14/23 2104 103/63   Heart Rate 01/14/23 2104 (!) 116   Respiratory Rate 01/14/23 2116 18   Temperature 01/14/23 2104 37.3 C (99.1 F)   SpO2 01/14/23 2104 95 %   Weight 01/14/23 2116 64.8 kg (142 lb 12.8 oz)   Height 01/14/23 2116 1.499 m (4\' 11" )     Constitutional: The patient is alert and oriented to person, place, and time. Well-developed and well-nourished.  HENT: Atraumatic, normocephalic head.   Eyes: Pupils equal and round, reactive to light. No scleral icterus. Normal conjunctiva. Extraocular movements are intact.  Neck: Supple, non-tender, no nuchal rigidity, no adenopathy.   Lungs: Clear to auscultation bilaterally. Symmetric and equal expansion. No respiratory distress or retractions.  Cardiovascular: Heart is S1-S2 regular rate and regular rhythm without murmur click or rub.  Abdomen:  Soft, non-distended. No tenderness to palpation without evidence of rebound or guarding. No pulsatile masses. No organomegaly.   Extremities: Full range of motion, no clubbing, cyanosis, or edema. Pulses 2+, capillary refill <2 seconds.  Skin: Warm and dry. No cyanosis, jaundice, rash or lesion.  Neurologic: Alert and oriented x3. Normal facial symmetry and speech, Normal upper and lower extremity strength, and  grossly normal sensation.     DIFFERENTIAL DIAGNOSIS     Migraine Headache   Intercranial Mass  Intractable Nausea/Vomiting  Failure of Outpatient Management      DIAGNOSTIC STUDIES     Labs:    Results for orders placed or performed during the hospital encounter of 01/14/23   COMPREHENSIVE METABOLIC PANEL, NON-FASTING   Result Value Ref Range    SODIUM 138 136 - 145 mmol/L    POTASSIUM 3.7 3.5 - 5.1 mmol/L    CHLORIDE 109 96 - 111 mmol/L    CO2 TOTAL 20 (L) 22 - 30 mmol/L    ANION GAP 9 4 - 13 mmol/L    BUN 15 8 - 25 mg/dL    CREATININE 5.95 6.38 - 1.05 mg/dL    BUN/CREA RATIO 18 6 - 22    ALBUMIN 4.2 3.5 - 5.0 g/dL     CALCIUM 9.0 8.6 - 75.6 mg/dL    GLUCOSE  433 65 - 295 mg/dL    ALKALINE PHOSPHATASE 84 40 - 110 U/L    ALT (SGPT) 27 (H) 8 - 22 U/L    AST (SGOT)  19 8 - 45 U/L    BILIRUBIN TOTAL 0.4 0.3 - 1.3 mg/dL    PROTEIN TOTAL 7.9 6.4 - 8.3 g/dL    ESTIMATED GFR - FEMALE >90 >=60 mL/min/BSA   HCG, PLASMA OR SERUM QUANTITATIVE, PREGNANCY   Result Value Ref Range    HCG QUANTITATIVE PREGNANCY <2 <10 mIU/mL   BLUE TOP TUBE   Result Value Ref Range    RAINBOW/EXTRA TUBE AUTO RESULT Yes    GRAY TOP TUBE   Result Value Ref Range    RAINBOW/EXTRA TUBE AUTO RESULT Yes    CBC WITH DIFF   Result Value Ref Range    WBC 12.7 (H) 3.7 - 11.0 x10^3/uL    RBC 4.63 3.85 - 5.22 x10^6/uL    HGB 14.2 11.5 - 16.0 g/dL    HCT 18.8 41.6 - 60.6 %    MCV 89.4 78.0 - 100.0 fL    MCH 30.7 26.0 - 32.0 pg    MCHC 34.3 31.0 - 35.5 g/dL    RDW-CV 30.1 60.1 - 09.3 %    PLATELETS 315 150 - 400 x10^3/uL    MPV 9.3 8.7 - 12.5 fL    NEUTROPHIL % 90.4 %    LYMPHOCYTE % 5.3 %    MONOCYTE % 3.6 %    EOSINOPHIL % 0.0 %    BASOPHIL % 0.3 %    NEUTROPHIL # 11.50 (H) 1.50 - 7.70 x10^3/uL    LYMPHOCYTE # 0.68 (L) 1.00 - 4.80 x10^3/uL    MONOCYTE # 0.46 0.20 - 1.10 x10^3/uL    EOSINOPHIL # <0.10 <=0.50 x10^3/uL    BASOPHIL # <0.10 <=0.20 x10^3/uL    IMMATURE GRANULOCYTE % 0.4 0.0 - 1.0 %    IMMATURE GRANULOCYTE # <0.10 <0.10 x10^3/uL     Labs reviewed and interpreted by me.    Radiology:    CT BRAIN WO IV CONTRAST   Preliminary Result   No evidence of an acute intracranial process.               Radiologist location ID: [id number]           Radiological imaging interpreted by radiologist. Report reviewed by me.    ED PROGRESS NOTE / MEDICAL DECISION MAKING     Old records reviewed by me:  I have reviewed the nurse's notes. I have  reviewed the patient's problem list and pertinent past medical records.    Orders Placed This Encounter    CT BRAIN WO IV CONTRAST    COMPREHENSIVE METABOLIC PANEL, NON-FASTING    HCG, PLASMA OR SERUM QUANTITATIVE, PREGNANCY    CBC WITH DIFF    INSERT & MAINTAIN PERIPHERAL IV ACCESS     dexAMETHasone 10 mg/mL injection    diphenhydrAMINE (BENADRYL) 50 mg/mL injection    ketorolac (TORADOL) 30 mg/mL injection    prochlorperazine (COMPAZINE) 5 mg/mL injection          Interpretation:    Medical Decision Making  Amount and/or Complexity of Data Reviewed  Independent Historian:      Details: Spanish translator used.  Labs: ordered.     Details: CBC shows high WBC at 12.7  CMP shows:  - CO2 total low at 20  - ALT high at 27  Otherwise, normal.  Radiology: ordered.     Details:   CT brain negative    Risk  Prescription drug management.       Summary:     42 year old female presents with headache for the past 4 days. Patient reports tingling like sensation to left side of head with associated nausea. Patient also had a witnessed syncopal episode around 1900 this evening by her husband, states he caught her prior to her falling. Patient was seen for headache this morning and was discharged with Excedrin. Reports no significant improvement with it. No history of similar episodes in the past. Denies any vomiting, vision changes, chest pain, shortness of breath, diaphoresis, or any measured fevers at this time. No known allergies reported.    Work-Up:    CBC, CMP, and HCG initially ordered.    2300: Initial evaluation is complete at this time. I discussed with the patient that I would order CT brain to further evaluate. Will also order dex, Toradol, benadryl, and compazine to treat. Patient is agreeable with the treatment plan at this time.     Final Decision Making:     1610: Workup reviewed. On recheck, the patient is in no acute distress and feels improved after treatment. I explained the results of the diagnostic studies. Patient will be discharged. Patient is to follow up with Marjory Lies, MD (neurology) to further manage headaches. I discussed the diagnosis, disposition, and follow-up plan. The patient understood and is in accordance with the treatment plan at this time. Patient is to return here to the  Emergency Department if new or worsening symptoms appear. All of their questions have been answered to their satisfaction. The patient is in stable condition at the time of discharge.     OPIATE PRESCRIPTION       Not applicable    CORE MEASURES      Not applicable    CRITICAL CARE TIME      Not applicable     PRE-DISPOSITION VITALS      Pre-Disposition Vitals:  Filed Vitals:    01/14/23 2116 01/14/23 2345 01/15/23 0000 01/15/23 0030   BP:   113/80 103/77   Pulse:   (!) 118    Resp: 18      Temp:       SpO2:  99% 100% 98%     CLINICAL IMPRESSION     Clinical Impression   Migraine headache (Primary)     DISPOSITION/PLAN     Discharged    Prescriptions:     New Prescriptions    No medications on file  Follow-Up:     Marjory Lies, MD  [address]  [place] [address]  [phone]  Call in 2 days  for follow up      Condition at Disposition: Stable        SCRIBE ATTESTATION STATEMENT  I Burtis Junes, SCRIBE scribed for Kara Pacer, MD.     Documentation assistance provided for Kara Pacer, MD by Burtis Junes, SCRIBE. Information recorded by the scribe was done at my direction and has been reviewed and validated by me Kara Pacer, MD.

## 2023-01-14 NOTE — ED Triage Notes (Signed)
Pt c/o HA x 4 days with tingling to left side of head, nausea and syncopal episode. Syncopal episode was witness by her husband and he caught her and laid her down on the cough. This episode was around 7 pm. Pt was seen at clinic earlier this morning for her symptoms and they prescribed her Excedrin migraine. She states the medication did not help. No blood thinners. Denies blurry vision or light sensitivity. No pmh

## 2023-01-15 ENCOUNTER — Emergency Department (HOSPITAL_COMMUNITY): Payer: Self-pay

## 2023-01-15 DIAGNOSIS — R519 Headache, unspecified: Secondary | ICD-10-CM

## 2023-01-15 LAB — RED TOP TUBE

## 2023-01-15 LAB — GREEN TUBE

## 2023-01-15 LAB — GOLD TOP TUBE

## 2023-01-15 LAB — LIGHT GREEN TOP TUBE

## 2023-01-15 LAB — BLUE TOP TUBE

## 2023-01-15 LAB — LAVENDER TOP TUBE

## 2023-01-15 LAB — GRAY TOP TUBE

## 2023-01-15 NOTE — ED Nurses Note (Signed)
Discharged pt at this time; reviewed discharge paperwork.      Current Discharge Medication List        CONTINUE these medications - NO CHANGES were made during your visit.        Details   cyclobenzaprine 10 mg Tablet  Commonly known as: FLEXERIL   10 mg, Oral, 3 TIMES DAILY PRN  Qty: 12 Tablet  Refills: 0     oxyCODONE-acetaminophen 5-325 mg Tablet  Commonly known as: PERCOCET   1 Tablet, Oral, EVERY 6 HOURS PRN  Qty: 12 Tablet  Refills: 0

## 2023-01-15 NOTE — ED Nurses Note (Signed)
Doyle Askew ID# [id number] used to translate plan of care. IV inserted in RUA with US guidance x1 stick.  Meds given as ordered.

## 2023-01-15 NOTE — Discharge Instructions (Addendum)
Thank you for choosing [facility] for your emergency health care needs. Please follow up your symptoms and any and all findings on labs or imaging that was obtained today with your primary provider. It has been our privilege to take care of you today. If at any time you feel that your condition is worsening, call your doctor or return to the Emergency Department for reevaluation. CALL 911 IF YOU THINK YOU ARE HAVING A MEDICAL EMERGENCY. Return to the Emergency Department if you are unable to obtain the recommended follow-up treatment or you are not better as expected.

## 2023-01-15 NOTE — ED Nurses Note (Signed)
Patient to CT at this time

## 2023-06-29 ENCOUNTER — Emergency Department (HOSPITAL_COMMUNITY): Payer: Self-pay

## 2023-06-29 ENCOUNTER — Emergency Department
Admission: EM | Admit: 2023-06-29 | Discharge: 2023-06-29 | Disposition: A | Discharge status: Home / Self Care | Payer: Self-pay | Attending: Emergency Medicine | Admitting: Emergency Medicine

## 2023-06-29 ENCOUNTER — Other Ambulatory Visit: Payer: Self-pay

## 2023-06-29 DIAGNOSIS — R10811 Right upper quadrant abdominal tenderness: Secondary | ICD-10-CM | POA: Insufficient documentation

## 2023-06-29 DIAGNOSIS — Z3200 Encounter for pregnancy test, result unknown: Secondary | ICD-10-CM | POA: Insufficient documentation

## 2023-06-29 DIAGNOSIS — R109 Unspecified abdominal pain: Secondary | ICD-10-CM

## 2023-06-29 DIAGNOSIS — R1011 Right upper quadrant pain: Secondary | ICD-10-CM

## 2023-06-29 LAB — CBC WITH DIFF
BASOPHIL #: <0.1 10*3/uL (ref <=0.20)
BASOPHIL %: 0.6 %
EOSINOPHIL #: 0.26 10*3/uL (ref <=0.50)
EOSINOPHIL %: 3.7 %
HCT: 45 % (ref 34.8–46.0)
HGB: 15.1 g/dL (ref 11.5–16.0)
IMMATURE GRANULOCYTE #: <0.1 10*3/uL (ref <0.10)
IMMATURE GRANULOCYTE %: 0.3 % (ref 0.0–1.0)
LYMPHOCYTE #: 2.33 10*3/uL (ref 1.00–4.80)
LYMPHOCYTE %: 33 %
MCH: 30.6 pg (ref 26.0–32.0)
MCHC: 33.6 g/dL (ref 31.0–35.5)
MCV: 91.3 fL (ref 78.0–100.0)
MONOCYTE #: 0.49 10*3/uL (ref 0.20–1.10)
MONOCYTE %: 6.9 %
MPV: 9.5 fL (ref 8.7–12.5)
NEUTROPHIL #: 3.93 10*3/uL (ref 1.50–7.70)
NEUTROPHIL %: 55.5 %
PLATELETS: 353 10*3/uL (ref 150–400)
RBC: 4.93 10*6/uL (ref 3.85–5.22)
RDW-CV: 12.5 % (ref 11.5–15.5)
WBC: 7.1 10*3/uL (ref 3.7–11.0)

## 2023-06-29 LAB — URINALYSIS WITH MICROSCOPIC REFLEX IF INDICATED BMC/JMC ONLY
BILIRUBIN: NEGATIVE mg/dL
GLUCOSE: NEGATIVE mg/dL
KETONES: NEGATIVE mg/dL
NITRITE: NEGATIVE
PH: 6.5 (ref <8.0)
PROTEIN: NEGATIVE mg/dL
SPECIFIC GRAVITY: 1.02 (ref <1.022)
UROBILINOGEN: <2 mg/dL (ref <=2.0)

## 2023-06-29 LAB — COMPREHENSIVE METABOLIC PANEL, NON-FASTING
ALBUMIN: 4.6 g/dL (ref 3.5–5.0)
ALKALINE PHOSPHATASE: 105 U/L (ref 40–110)
ALT (SGPT): 25 U/L (ref <31)
ANION GAP: 7 mmol/L (ref 4–13)
AST (SGOT): 22 U/L (ref 11–34)
BILIRUBIN TOTAL: 0.2 mg/dL — ABNORMAL LOW (ref 0.3–1.3)
BUN/CREA RATIO: 22 (ref 6–22)
BUN: 16 mg/dL (ref 8–25)
CALCIUM: 9.7 mg/dL (ref 8.6–10.2)
CHLORIDE: 109 mmol/L (ref 96–111)
CO2 TOTAL: 23 mmol/L (ref 22–30)
CREATININE: 0.74 mg/dL (ref 0.60–1.05)
ESTIMATED GFR - FEMALE: >90 mL/min/BSA (ref >=60)
GLUCOSE: 99 mg/dL (ref 65–125)
POTASSIUM: 3.8 mmol/L (ref 3.5–5.1)
PROTEIN TOTAL: 8.7 g/dL — ABNORMAL HIGH (ref 6.4–8.3)
SODIUM: 139 mmol/L (ref 136–145)

## 2023-06-29 LAB — URINALYSIS, MICROSCOPIC

## 2023-06-29 LAB — LIPASE: LIPASE: 26 U/L (ref 10–60)

## 2023-06-29 LAB — HCG, URINE QUALITATIVE, PREGNANCY: HCG URINE QUALITATIVE: NEGATIVE

## 2023-06-29 MED ORDER — FAMOTIDINE 20 MG TABLET
20.0000 mg | ORAL_TABLET | Freq: Every evening | ORAL | 0 refills | Status: AC
Start: 2023-06-29 — End: ?

## 2023-06-29 MED ORDER — SODIUM CHLORIDE 0.9 % (FLUSH) INJECTION SYRINGE
10.0000 mL | INJECTION | Freq: Three times a day (TID) | INTRAMUSCULAR | Status: DC
Start: 2023-06-29 — End: 2023-06-30
  Administered 2023-06-29: 0 mL via INTRAVENOUS

## 2023-06-29 MED ORDER — ACETAMINOPHEN 500 MG TABLET
1000.0000 mg | ORAL_TABLET | Freq: Four times a day (QID) | ORAL | 0 refills | Status: AC | PRN
Start: 2023-06-29 — End: ?

## 2023-06-29 MED ORDER — DICYCLOMINE 10 MG/ML INTRAMUSCULAR SOLUTION
20.0000 mg | Freq: Once | INTRAMUSCULAR | Status: AC
Start: 2023-06-29 — End: 2023-06-29
  Administered 2023-06-29: 20 mg via INTRAMUSCULAR
  Filled 2023-06-29: qty 2

## 2023-06-29 MED ORDER — ACETAMINOPHEN 500 MG TABLET
1000.0000 mg | ORAL_TABLET | ORAL | Status: AC
Start: 2023-06-29 — End: 2023-06-29
  Administered 2023-06-29: 1000 mg via ORAL
  Filled 2023-06-29: qty 2

## 2023-06-29 NOTE — Discharge Instructions (Signed)
 Thank you for choosing [facility] for your emergency health care needs. Please follow up your symptoms and any and all findings on labs or imaging that was obtained today with your primary provider. It has been our privilege to take care of you today. If at any time you feel that your condition is worsening, call your doctor or return to the Emergency Department for reevaluation. CALL 911 IF YOU THINK YOU ARE HAVING A MEDICAL EMERGENCY. Return to the Emergency Department if you are unable to obtain the recommended follow-up treatment or you are not better as expected.

## 2023-06-29 NOTE — ED Nurses Note (Signed)
 Patient discharged home with family.  AVS reviewed with patient/care giver.  A written copy of the AVS and discharge instructions was given to the patient/care giver.  Questions sufficiently answered as needed.  Patient/care giver encouraged to follow up with PCP as indicated.  In the event of an emergency, patient/care giver instructed to call 911 or go to the nearest emergency room.      Current Discharge Medication List        START taking these medications.        Details   acetaminophen  500 mg Tablet  Commonly known as: TYLENOL    Take 2 Tablets (1,000 mg total) by mouth Every 6 hours as needed for Pain  Qty: 30 Tablet  Refills: 0     famotidine  20 mg Tablet  Commonly known as: PEPCID    Tomar 1 comprimido (20 mg en total) por la boca todas las tarde noche.  (Take 1 Tablet (20 mg total) by mouth Every evening)  Qty: 28 Tablet  Refills: 0            CONTINUE these medications - NO CHANGES were made during your visit.        Details   cyclobenzaprine  10 mg Tablet  Commonly known as: FLEXERIL    Take 1 Tablet (10 mg total) by mouth Three times a day as needed for Muscle spasms  Qty: 12 Tablet  Refills: 0     oxyCODONE -acetaminophen  5-325 mg Tablet  Commonly known as: PERCOCET   Tomar 1 comprimido por la boca cada 6 horas segn sea necesario para tratar el dolor.  (Take 1 Tablet by mouth Every 6 hours as needed for Pain)  Qty: 12 Tablet  Refills: 0            Pt treated in triage. Pt assessment deferred to attending provider/MD.

## 2023-06-29 NOTE — ED Provider Notes (Signed)
 Milburn Aliment, MD  [facility] Medicine  Emergency Department Visit Note    Primary Care Provider: No Pcp  Means of arrival: Private Vehicle     History/Exam limitations: None    Chief Complaint: Abdominal pain    History of Present Illness     [name], 05/11/1980, is a/an 43 y.o. female who presents to the Emergency Department with abdominal pain. Patient reports right-sided abdominal pain that radiates to her back beginning four weeks ago. Notes worsening pain over past 8 days. Adds pain worsens with eating. States she felt a mass at area of pain with bending. Denies vomiting, fevers and urinary issues.     Karoll, Spanish Translator [phone]    Review of Systems     The pertinent positive and negative symptoms are as per the HPI. They are otherwise negative as reviewed.    Patient History     Past Medical History:  Past Medical History:   Diagnosis Date    History of 2019 novel coronavirus disease (COVID-19) 12/01/2020   See Triage Note    Past Surgical History:  Past Surgical History:   Procedure Laterality Date    HX CESAREAN SECTION     See Triage Note    Family History:  Non-contributory    Social History:  Social History     Tobacco Use    Smoking status: Never    Smokeless tobacco: Never   Vaping Use    Vaping status: Never Used   Substance Use Topics    Alcohol use: Never    Drug use: Never     Social History     Substance and Sexual Activity   Drug Use Never     Current Outpatient Medications:   See triage note    Allergies:   No Known Allergies    Physical Exam     Vital Signs:  Filed Vitals:    06/29/23 1952   BP: 116/72   Pulse: 80   Resp: 16   Temp: 36.7 C (98 F)   SpO2: 100%     Pulse Ox interpreted by me: 100% on RA, normal    Physical Exam:   Constitutional: No acute distress  Head: Normocephalic and atraumatic.   ENT: Moist mucous membranes. No erythema or exudates in the oropharynx. Normal voice.  Eyes: EOM are normal. Pupils are equal, round, and reactive to light. No scleral icterus.    Neck: Neck supple. FROM neck.  Cardiovascular: Normal rate and regular rhythm. No murmur heard. 2+ distal pulses all 4 extremities.  Pulmonary/Chest: Effort normal and breath sounds normal.   Abdominal: Soft with no distension. Tender in the RUQ.   Back: No tenderness in right CVA.  Musculoskeletal:  No clubbing or cyanosis.  Neurological: Patient is alert and awake. Strength and sensation normal in all extremities. Normal facial symmetry and speech.   Skin: Skin is warm and dry.      Diagnostics     Labs:    Results for orders placed or performed during the hospital encounter of 06/29/23   COMPREHENSIVE METABOLIC PANEL, NON-FASTING   Result Value Ref Range    SODIUM 139 136 - 145 mmol/L    POTASSIUM 3.8 3.5 - 5.1 mmol/L    CHLORIDE 109 96 - 111 mmol/L    CO2 TOTAL 23 22 - 30 mmol/L    ANION GAP 7 4 - 13 mmol/L    BUN 16 8 - 25 mg/dL    CREATININE 0.98 1.19 -  1.05 mg/dL    BUN/CREA RATIO 22 6 - 22    ALBUMIN 4.6 3.5 - 5.0 g/dL     CALCIUM  9.7 8.6 - 10.2 mg/dL    GLUCOSE 99 65 - 161 mg/dL    ALKALINE PHOSPHATASE 105 40 - 110 U/L    ALT (SGPT) 25 <31 U/L    AST (SGOT)  22 11 - 34 U/L    BILIRUBIN TOTAL 0.2 (L) 0.3 - 1.3 mg/dL    PROTEIN TOTAL 8.7 (H) 6.4 - 8.3 g/dL    ESTIMATED GFR - FEMALE >90 >=60 mL/min/BSA   LIPASE   Result Value Ref Range    LIPASE 26 10 - 60 U/L   URINALYSIS WITH MICROSCOPIC REFLEX IF INDICATED [facility] ONLY   Result Value Ref Range    COLOR Yellow Colorless, Dark Yellow, Light Yellow, Straw, Yellow    APPEARANCE Clear Clear    PH 6.5 <8.0    LEUKOCYTES Small (A) Negative WBCs/uL    NITRITE Negative Negative    PROTEIN Negative Negative, 10  mg/dL    GLUCOSE Negative Negative mg/dL    KETONES Negative Negative, 100  mg/dL    UROBILINOGEN < 2.0 <=0.9 mg/dL    BILIRUBIN Negative Negative mg/dL    BLOOD Moderate (A) Negative mg/dL    SPECIFIC GRAVITY 6.045 <1.022   HCG, URINE QUALITATIVE, PREGNANCY   Result Value Ref Range    HCG URINE QUALITATIVE Negative Negative   CBC WITH DIFF   Result Value  Ref Range    WBC 7.1 3.7 - 11.0 x10^3/uL    RBC 4.93 3.85 - 5.22 x10^6/uL    HGB 15.1 11.5 - 16.0 g/dL    HCT 40.9 81.1 - 91.4 %    MCV 91.3 78.0 - 100.0 fL    MCH 30.6 26.0 - 32.0 pg    MCHC 33.6 31.0 - 35.5 g/dL    RDW-CV 78.2 95.6 - 21.3 %    PLATELETS 353 150 - 400 x10^3/uL    MPV 9.5 8.7 - 12.5 fL    NEUTROPHIL % 55.5 %    LYMPHOCYTE % 33.0 %    MONOCYTE % 6.9 %    EOSINOPHIL % 3.7 %    BASOPHIL % 0.6 %    NEUTROPHIL # 3.93 1.50 - 7.70 x10^3/uL    LYMPHOCYTE # 2.33 1.00 - 4.80 x10^3/uL    MONOCYTE # 0.49 0.20 - 1.10 x10^3/uL    EOSINOPHIL # 0.26 <=0.50 x10^3/uL    BASOPHIL # <0.10 <=0.20 x10^3/uL    IMMATURE GRANULOCYTE % 0.3 0.0 - 1.0 %    IMMATURE GRANULOCYTE # <0.10 <0.10 x10^3/uL   URINALYSIS, MICROSCOPIC   Result Value Ref Range    RBCS 20-30 (A) 0 - 2 /hpf    WBCS 2-5 (A) 0 - 2 /hpf    BACTERIA Slight (A) None /hpf    SQUAMOUS EPITHELIAL 20-30 (A) 0 - 2 /hpf    TRANSITIONAL EPITHELIAL 0-2 0 - 2 /hpf   Labs reviewed and interpreted by me.    Radiology:   US  GALLBLADDER  US  GALLBLADDER   Final Result   Normal sonographic study of the right upper quadrant of the abdomen.   1. Normal-appearing gallbladder. No cholelithiasis or sonographic evidence of acute cholecystitis. No abnormal bile duct dilation.         Radiologist location ID: [id number]         Interpreted by radiologist and independently reviewed by me.    ED Progress Note/ Medical Decision Making  Old records reviewed by me:  I reviewed the patient's past medical records.    Orders Placed This Encounter    US  GALLBLADDER    CBC/DIFF    COMPREHENSIVE METABOLIC PANEL, NON-FASTING    LIPASE    URINALYSIS WITH MICROSCOPIC REFLEX IF INDICATED [facility] ONLY    HCG, URINE QUALITATIVE, PREGNANCY    CBC WITH DIFF    URINALYSIS, MICROSCOPIC    Referral to GASTROENTEROLOGY - [place] - [place] - FISHKIN, GUPTA, HADI, MAGEE, SOULE    INSERT & MAINTAIN PERIPHERAL IV ACCESS    NS flush syringe    dicyclomine  (BENTYL ) 10 mg/mL IM injection    acetaminophen   (TYLENOL ) tablet    acetaminophen  (TYLENOL ) 500 mg Oral Tablet    famotidine  (PEPCID ) 20 mg Oral Tablet     Medical Decision Making  43 year old female presents to ED with abdominal pain. Patient reports right-sided abdominal pain that radiates to her back beginning four weeks ago. Notes worsening pain over past 8 days. Adds pain worsens with eating. States she felt a mass at area of pain with bending. Denies vomiting, fevers and urinary issues.     Karoll, Spanish Translator [phone]    Amount and/or Complexity of Data Reviewed  Radiology: ordered.    Risk  OTC drugs.  Prescription drug management.       2113: Initial evaluation is complete at this time. I discussed with the patient that I would order US  gallbladder, CBC, CMP, Lipase, UA, and HCG to further evaluate. I will order Bentyl  and Tylenol  to treat. Patient is agreeable with the treatment plan at this time.   2205: On recheck, the patient is stable. I explained the results of the diagnostic studies. Patient will be discharged with Tylenol  and Mobic. Patient is to follow up with Alfrieda Infante in one week. I discussed the diagnosis, disposition, and follow-up plan. The patient understood and is in accordance with the treatment plan at this time. Patient is to return here to the Emergency Department if new or worsening symptoms appear. All of their questions have been answered to their satisfaction. The patient is in stable condition at the time of discharge. Interpreted by Lissa Riding Translator [phone]    Pre-Disposition Vitals:  Filed Vitals:    06/29/23 1952   BP: 116/72   Pulse: 80   Resp: 16   Temp: 36.7 C (98 F)   SpO2: 100%     Clinical Impression     Clinical Impression   Abdominal pain (Primary)      Disposition/Plan     Discharged    Prescriptions:  New Prescriptions    ACETAMINOPHEN  (TYLENOL ) 500 MG ORAL TABLET    Take 2 Tablets (1,000 mg total) by mouth Every 6 hours as needed for Pain    FAMOTIDINE  (PEPCID ) 20 MG ORAL TABLET    Take 1 Tablet  (20 mg total) by mouth Every evening     Follow Up:  Celine Collard, MD  [address]  [place] [address]  [phone]    In 1 week      Alfrieda Infante, MD  [address]  [address]  [place] [address]  [phone]    In 1 week    Condition on Disposition: Stable    I Pujaba Chudasama, SCRIBE scribed for Edmonia Gottron, MD.     Documentation assistance provided for Edmonia Gottron, MD  by Seabron Cypress, SCRIBE. Information recorded by the scribe was done at my  direction and has been reviewed and validated by Edmonia Gottron, MD.    Note has been documented by Seabron Cypress on 06/29/2023

## 2023-08-25 ENCOUNTER — Other Ambulatory Visit (HOSPITAL_COMMUNITY): Payer: Self-pay

## 2023-08-25 DIAGNOSIS — R1013 Epigastric pain: Secondary | ICD-10-CM

## 2023-09-01 ENCOUNTER — Ambulatory Visit
Admission: RE | Admit: 2023-09-01 | Discharge: 2023-09-01 | Disposition: A | Discharge status: Home / Self Care | Payer: Self-pay | Source: Ambulatory Visit

## 2023-09-01 ENCOUNTER — Other Ambulatory Visit: Payer: Self-pay

## 2023-09-01 DIAGNOSIS — R1013 Epigastric pain: Secondary | ICD-10-CM | POA: Insufficient documentation

## 2023-12-08 ENCOUNTER — Telehealth (HOSPITAL_BASED_OUTPATIENT_CLINIC_OR_DEPARTMENT_OTHER): Payer: Self-pay

## 2023-12-08 NOTE — Telephone Encounter (Signed)
 Scheduled appointment and sent reminder letter with the appointment information.    Jenkins Earnie Sharps

## 2023-12-14 ENCOUNTER — Other Ambulatory Visit (HOSPITAL_COMMUNITY): Payer: Self-pay | Admitting: Physician Assistant

## 2023-12-14 DIAGNOSIS — Z1231 Encounter for screening mammogram for malignant neoplasm of breast: Secondary | ICD-10-CM

## 2024-01-27 ENCOUNTER — Ambulatory Visit (HOSPITAL_BASED_OUTPATIENT_CLINIC_OR_DEPARTMENT_OTHER): Payer: Self-pay
